# Patient Record
Sex: Female | Born: 1990
Health system: Southern US, Community
[De-identification: ages and names within clinical notes are randomized; demographics above are authoritative.]

## PROBLEM LIST (undated history)

## (undated) DIAGNOSIS — L309 Dermatitis, unspecified: Secondary | ICD-10-CM

## (undated) DIAGNOSIS — R519 Headache, unspecified: Secondary | ICD-10-CM

## (undated) DIAGNOSIS — E282 Polycystic ovarian syndrome: Secondary | ICD-10-CM

## (undated) DIAGNOSIS — N390 Urinary tract infection, site not specified: Secondary | ICD-10-CM

---

## 2011-05-29 ENCOUNTER — Encounter: Payer: Self-pay | Admitting: Emergency Medicine

## 2011-05-29 ENCOUNTER — Emergency Department (HOSPITAL_BASED_OUTPATIENT_CLINIC_OR_DEPARTMENT_OTHER)
Admission: EM | Admit: 2011-05-29 | Discharge: 2011-05-29 | Disposition: A | Discharge status: Home / Self Care | Payer: Managed Care, Other (non HMO) | Attending: Emergency Medicine | Admitting: Emergency Medicine

## 2011-05-29 DIAGNOSIS — J020 Streptococcal pharyngitis: Secondary | ICD-10-CM | POA: Insufficient documentation

## 2011-05-29 DIAGNOSIS — J0300 Acute streptococcal tonsillitis, unspecified: Secondary | ICD-10-CM

## 2011-05-29 LAB — CBC
HCT: 37.5 % (ref 36.0–46.0)
MCH: 26.9 pg (ref 26.0–34.0)
MCHC: 32.5 g/dL (ref 30.0–36.0)
MCV: 82.6 fL (ref 78.0–100.0)
Platelets: 274 10*3/uL (ref 150–400)
RDW: 13 % (ref 11.5–15.5)

## 2011-05-29 LAB — DIFFERENTIAL
Basophils Absolute: 0 10*3/uL (ref 0.0–0.1)
Basophils Relative: 0 % (ref 0–1)
Eosinophils Absolute: 0.1 10*3/uL (ref 0.0–0.7)
Eosinophils Relative: 1 % (ref 0–5)
Monocytes Absolute: 0.8 10*3/uL (ref 0.1–1.0)

## 2011-05-29 MED ORDER — PENICILLIN G BENZATHINE 1200000 UNIT/2ML IM SUSP
1.2000 10*6.[IU] | Freq: Once | INTRAMUSCULAR | Status: AC
  Administered 2011-05-29: 1.2 10*6.[IU] via INTRAMUSCULAR
  Filled 2011-05-29: qty 2

## 2011-05-29 MED ORDER — IBUPROFEN 800 MG PO TABS
800.0000 mg | ORAL_TABLET | Freq: Once | ORAL | Status: AC
  Administered 2011-05-29: 800 mg via ORAL
  Filled 2011-05-29: qty 1

## 2011-05-29 NOTE — ED Provider Notes (Signed)
History     CSN: [id number]  Arrival date & time 05/29/11  1352   First MD Initiated Contact with Patient 05/29/11 1635      Chief Complaint  Patient presents with  . Sore Throat  . Cough  . Nasal Congestion    (Consider location/radiation/quality/duration/timing/severity/associated sxs/prior treatment) HPI Runny nose, nasal congestion, cough, productive yellow green sputum, x two weeks.  Recently treated at her private doctor's office with a shot of dexamethasone.  She apparently got better after that and started getting worse with sore throat difficulty swallowing.  History reviewed. No pertinent past medical history.  History reviewed. No pertinent past surgical history.  History reviewed. No pertinent family history.  History  Substance Use Topics  . Smoking status: Never Smoker   . Smokeless tobacco: Never Used  . Alcohol Use: No    OB History    Grav Para Term Preterm Abortions TAB SAB Ect Mult Living                  Review of Systems  All other systems reviewed and are negative.    Allergies  Review of patient's allergies indicates no known allergies.  Home Medications   Current Outpatient Rx  Name Route Sig Dispense Refill  . ASPIRIN EFFERVESCENT 325 MG PO TBEF Oral Take 325 mg by mouth every 6 (six) hours as needed.      Marland Kitchen DIPHENHYDRAMINE HCL 25 MG PO CAPS Oral Take 25 mg by mouth every 6 (six) hours as needed.      Marland Kitchen LORATADINE 10 MG PO TABS Oral Take 10 mg by mouth daily.        BP 127/73  Pulse 112  Temp(Src) 98.6 F (37 C) (Oral)  Resp 20  Ht 5\' 9"  (1.753 m)  Wt 267 lb 6 oz (121.281 kg)  BMI 39.48 kg/m2  SpO2 99%  LMP 05/23/2011  Physical Exam  Nursing note and vitals reviewed. Constitutional: She is oriented to person, place, and time. She appears well-developed and well-nourished. No distress.  HENT:  Head: Normocephalic and atraumatic.  Mouth/Throat: Oropharyngeal exudate and posterior oropharyngeal erythema present. No tonsillar  abscesses.  Eyes: Pupils are equal, round, and reactive to light.  Neck: Normal range of motion.  Cardiovascular: Normal rate and intact distal pulses.   Pulmonary/Chest: No respiratory distress.  Abdominal: Normal appearance. She exhibits no distension.  Musculoskeletal: Normal range of motion.  Lymphadenopathy:    She has cervical adenopathy.  Neurological: She is alert and oriented to person, place, and time. No cranial nerve deficit.  Skin: Skin is warm and dry. No rash noted.  Psychiatric: She has a normal mood and affect. Her behavior is normal.    ED Course  Procedures   Centor Score =4    Labs Reviewed  DIFFERENTIAL - Abnormal; Notable for the following:    Neutro Abs 7.8 (*)    All other components within normal limits  CBC  MONONUCLEOSIS SCREEN   No results found.   Diagnosis:  #1.  Strep pharyngitis   MDM          Nelia Shi, MD 05/29/11 2321

## 2011-05-29 NOTE — ED Notes (Signed)
Runny nose, nasal congestion, cough, productive yellow green sputum, x two weeks.  No known fever.

## 2015-10-28 ENCOUNTER — Emergency Department (HOSPITAL_BASED_OUTPATIENT_CLINIC_OR_DEPARTMENT_OTHER): Payer: Managed Care, Other (non HMO)

## 2015-10-28 ENCOUNTER — Encounter (HOSPITAL_BASED_OUTPATIENT_CLINIC_OR_DEPARTMENT_OTHER): Payer: Self-pay | Admitting: Emergency Medicine

## 2015-10-28 ENCOUNTER — Emergency Department (HOSPITAL_BASED_OUTPATIENT_CLINIC_OR_DEPARTMENT_OTHER)
Admission: EM | Admit: 2015-10-28 | Discharge: 2015-10-28 | Disposition: A | Discharge status: Home / Self Care | Payer: Managed Care, Other (non HMO) | Attending: Emergency Medicine | Admitting: Emergency Medicine

## 2015-10-28 DIAGNOSIS — M25571 Pain in right ankle and joints of right foot: Secondary | ICD-10-CM | POA: Diagnosis present

## 2015-10-28 DIAGNOSIS — Z79899 Other long term (current) drug therapy: Secondary | ICD-10-CM | POA: Diagnosis not present

## 2015-10-28 DIAGNOSIS — M7661 Achilles tendinitis, right leg: Secondary | ICD-10-CM | POA: Insufficient documentation

## 2015-10-28 IMAGING — DX DG ANKLE COMPLETE 3+V*R*
3 series · 3 of 3 positions shown · non-contrast
Comparison: None.

CLINICAL DATA: Pain for 2 days.  No history of recent trauma

EXAM:
RIGHT ANKLE - COMPLETE 3+ VIEW

[ankle ap]
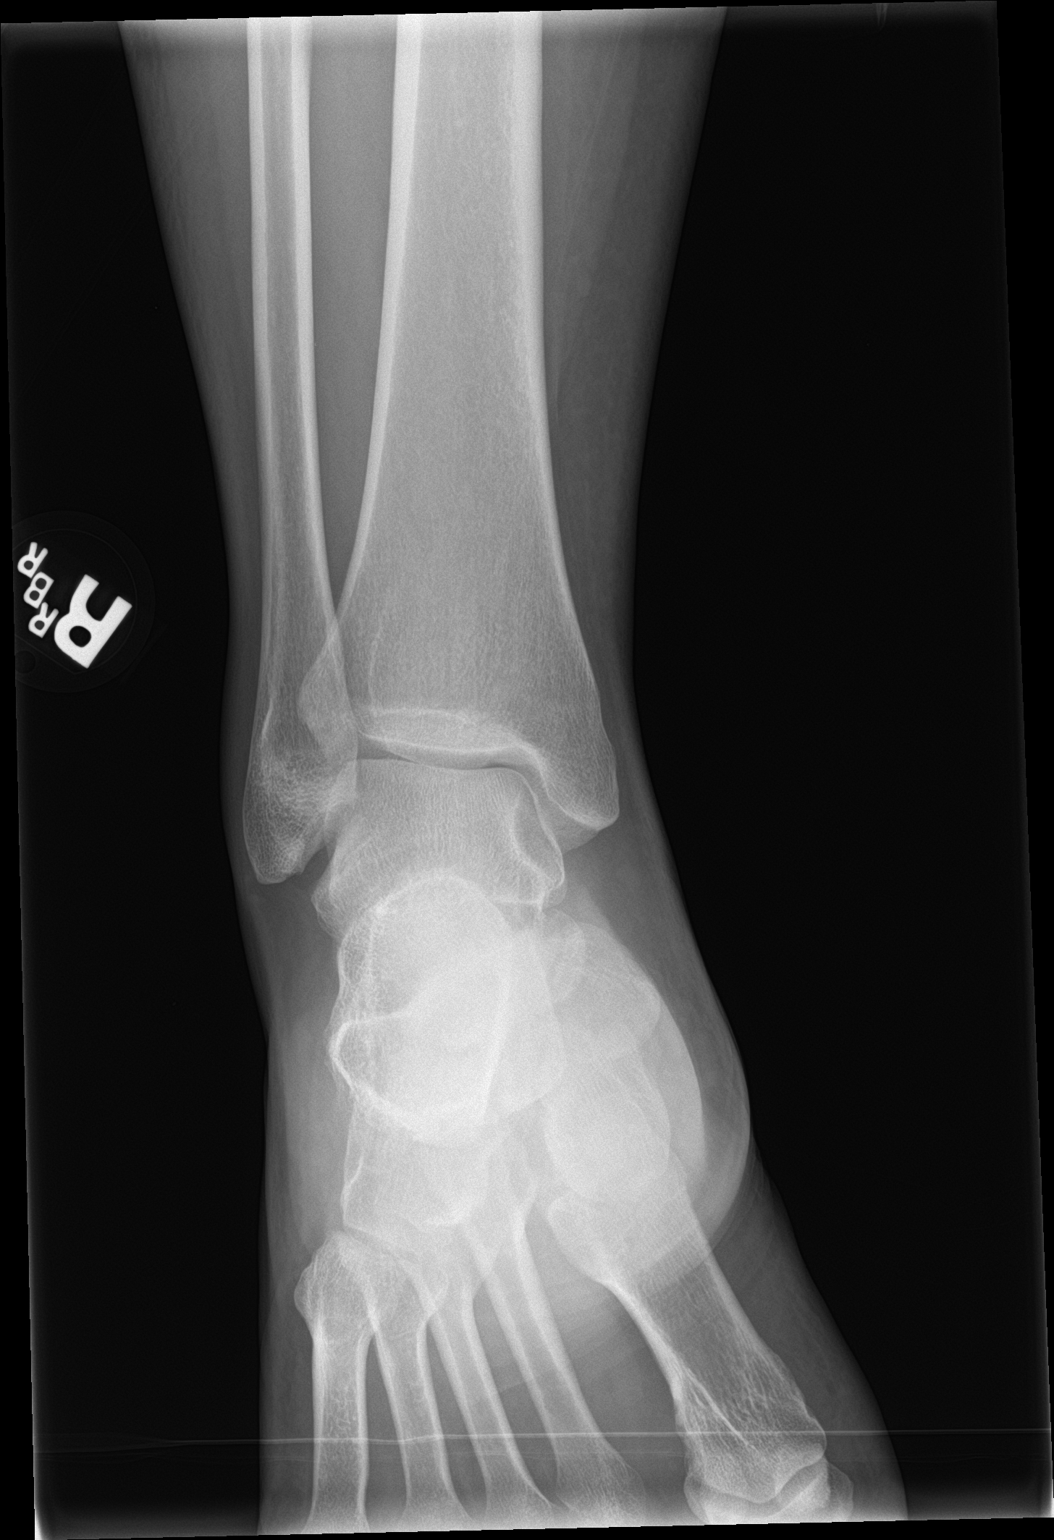

[ankle obl]
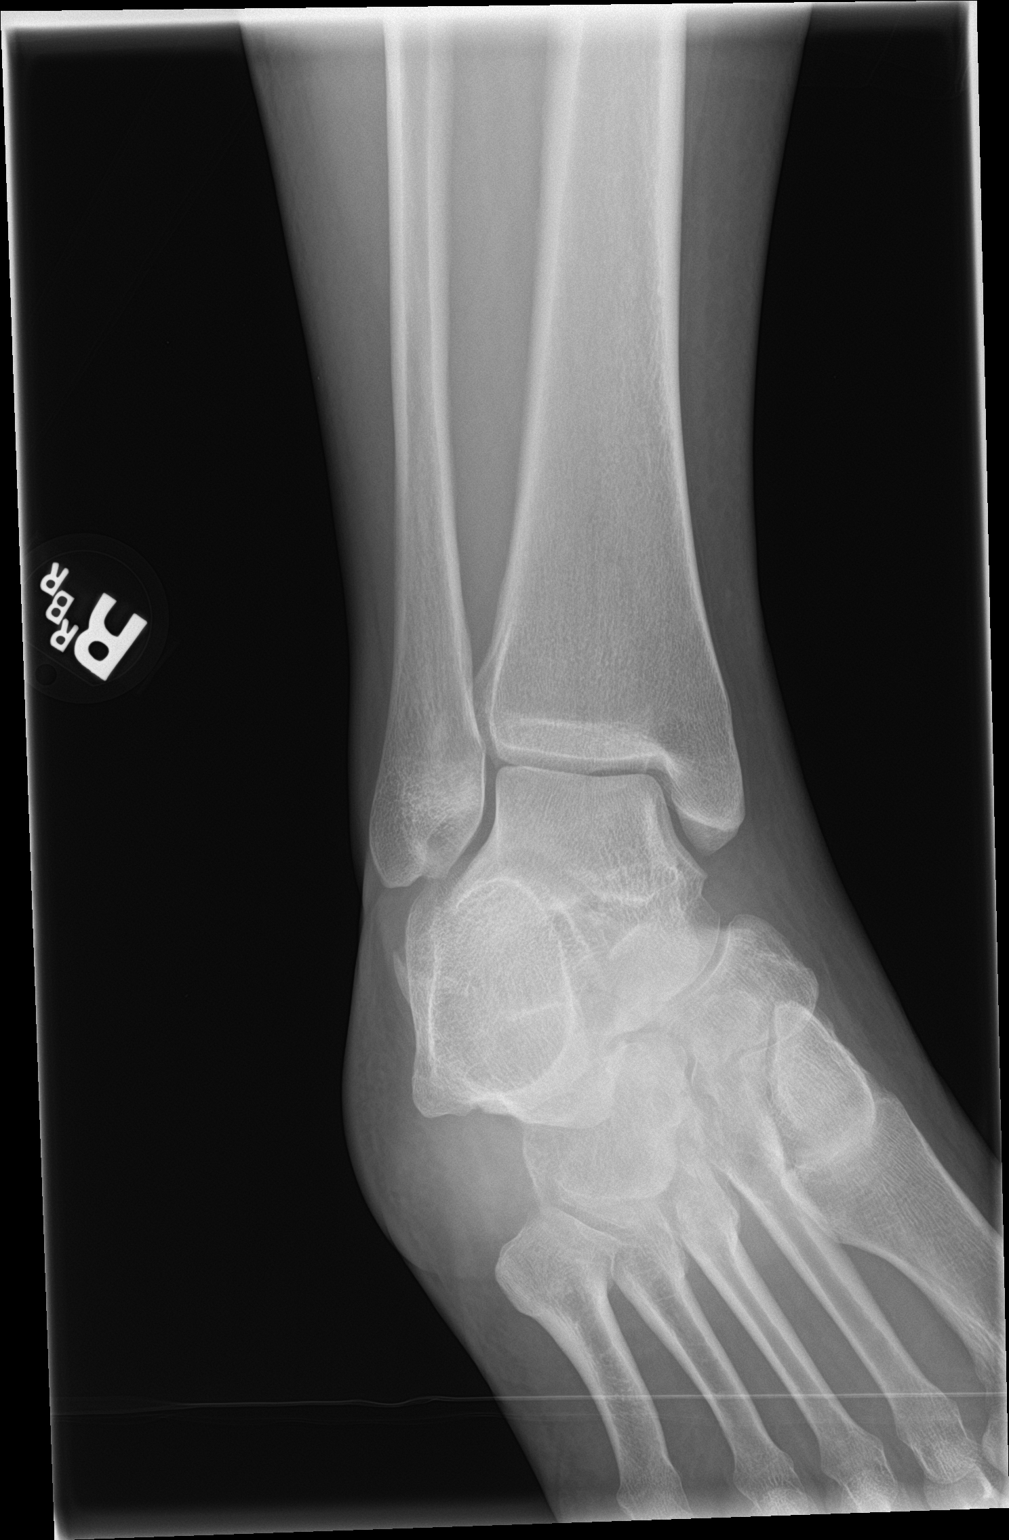

[ankle lat]
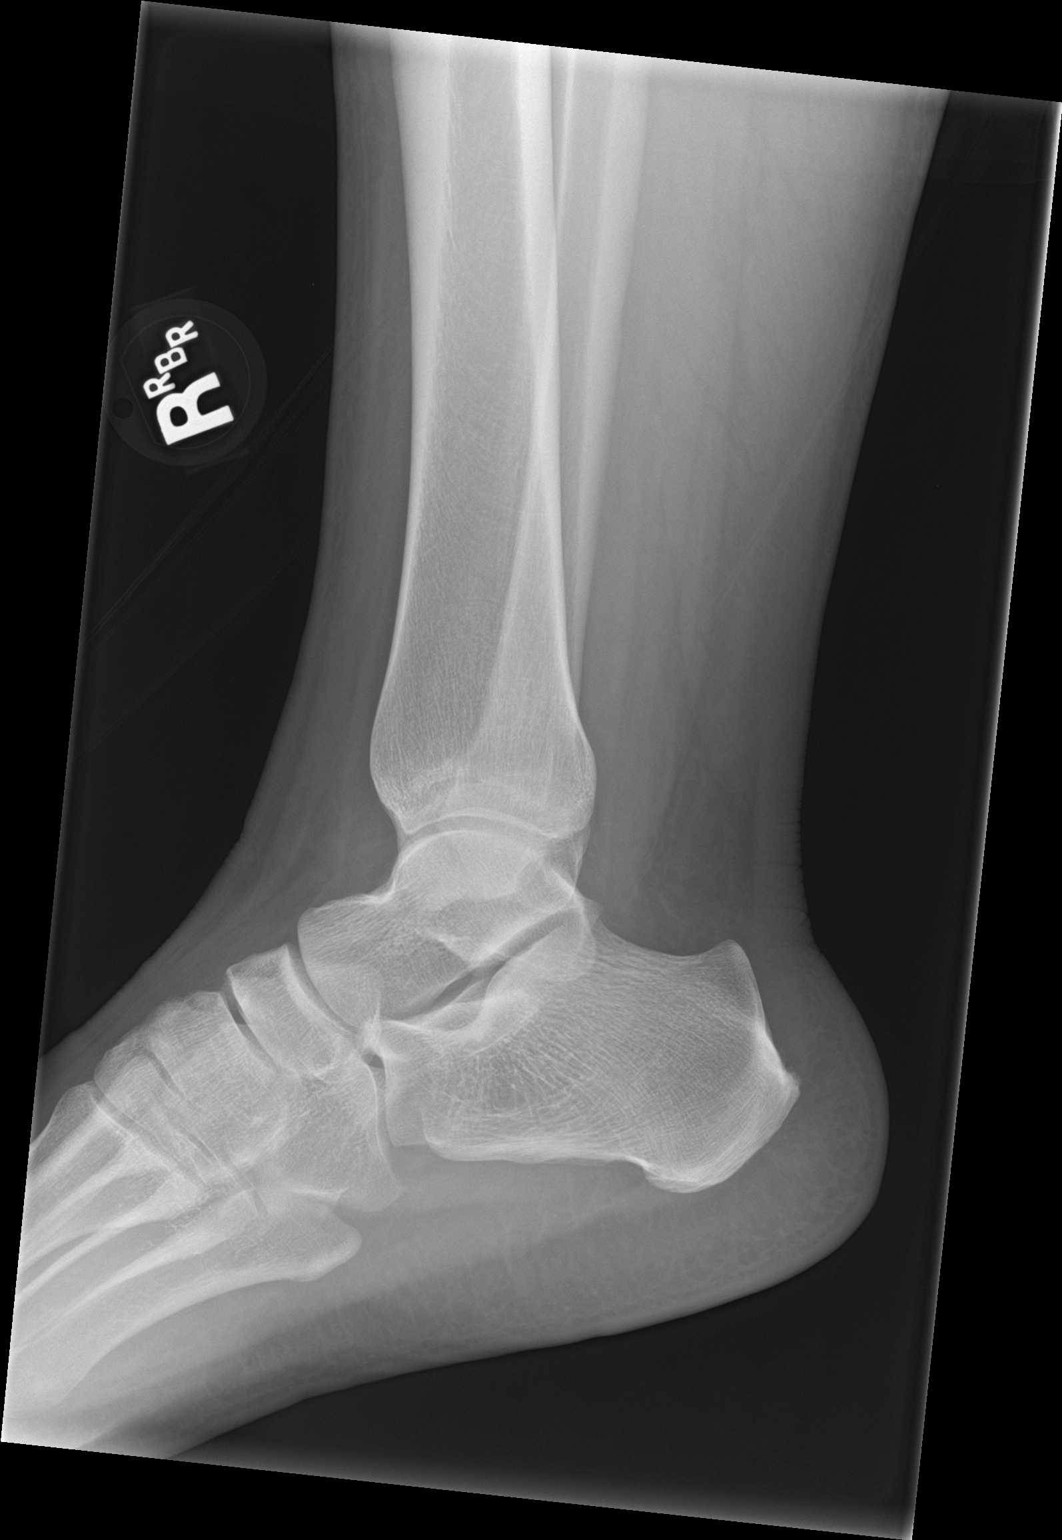

[3 of 3 positions shown; findings below may reference images not displayed]

FINDINGS: Frontal, oblique, and lateral views were obtained. There is no
demonstrable fracture or joint effusion. The ankle mortise appears
intact. No appreciable joint space narrowing.
IMPRESSION: No fracture or appreciable arthropathy. Ankle mortise appears
intact.

## 2015-10-28 MED ORDER — NAPROXEN 500 MG PO TABS: 500.0000 mg | ORAL_TABLET | Freq: Two times a day (BID) | ORAL | Status: DC | PRN

## 2015-10-28 MED ORDER — NAPROXEN 250 MG PO TABS
500.0000 mg | ORAL_TABLET | Freq: Once | ORAL | Status: AC
  Administered 2015-10-28: 500 mg via ORAL
  Filled 2015-10-28: qty 2

## 2015-10-28 MED FILL — NAPROXEN 500 MG TABLET: 500 | 15 days supply | Qty: 30 | Fill #0

## 2015-10-28 NOTE — ED Notes (Signed)
[facility] as ordered. Pt is smiling and laughing with staff in nad.

## 2015-10-28 NOTE — ED Provider Notes (Signed)
CSN: [id number]     Arrival date & time 10/28/15  0821 History   First MD Initiated Contact with Patient 10/28/15 [phone]     Chief Complaint  Patient presents with  . Ankle Pain     (Consider location/radiation/quality/duration/timing/severity/associated sxs/prior Treatment) HPI 25 year old female who presents with right ankle pain. She is otherwise healthy. She works at [employer] and states that she is on her feet for the majority of the day. States that 2 days ago developed pain behind her right ankle, that gradually worsened. She had difficulty walking earlier this morning and came to the ED for evaluation. She did not have any recent trauma or falls. Denies any ankle swelling. Has not had any fevers or chills. Denies any numbness or weakness.  History reviewed. No pertinent past medical history. History reviewed. No pertinent past surgical history. History reviewed. No pertinent family history. Social History  Substance Use Topics  . Smoking status: Never Smoker   . Smokeless tobacco: Never Used  . Alcohol Use: No   OB History    No data available     Review of Systems  Constitutional: Negative for fever.  Musculoskeletal: Positive for arthralgias (right ankle pain).  Skin: Negative for rash and wound.  Allergic/Immunologic: Negative for immunocompromised state.  Neurological: Negative for weakness and numbness.  Hematological: Does not bruise/bleed easily.  All other systems reviewed and are negative.     Allergies  Review of patient's allergies indicates no known allergies.  Home Medications   Prior to Admission medications   Medication Sig Start Date End Date Taking? Authorizing Provider  norgestimate-ethinyl estradiol (MONONESSA) 0.25-35 MG-MCG tablet Take 1 tablet by mouth daily.   Yes Historical Provider, MD  naproxen (NAPROSYN) 500 MG tablet Take 1 tablet (500 mg total) by mouth 2 (two) times daily as needed. 10/28/15   Lavera Guise, MD   BP 126/80 mmHg  Pulse  83  Temp(Src) 98.2 F (36.8 C) (Oral)  Resp 16  Ht  (1.753 m)  Wt 250 lb (113.399 kg)  BMI 36.90 kg/m2  SpO2 100%  LMP 10/05/2015 Physical Exam Physical Exam  Nursing note and vitals reviewed. Constitutional: Well developed, well nourished, non-toxic, and in no acute distress Head: Normocephalic and atraumatic.  Mouth/Throat: Moist mucous membranes. Neck: Normal range of motion. Neck supple.  Cardiovascular: +2 DP pulse of RLE, normal capillary refill distally of RLE Pulmonary/Chest: Effort normal Abdominal: Soft. There is no tenderness. Musculoskeletal: Tenderness to palpation along the Achilles tendon of the right foot. She has full dorsi plantar flexion, eversion and inversion of the right ankle.  Neurological: Alert, no facial droop, fluent speech, Full strength to ankle flexion and extension. Sensation to light touch intact throughout right lower extremity.  Skin: Skin is warm and dry.  Psychiatric: Cooperative  ED Course  Procedures (including critical care time) Labs Review Labs Reviewed - No data to display  Imaging Review Dg Ankle Complete Right  10/28/2015  CLINICAL DATA:  Pain for 2 days.  No history of recent trauma EXAM: RIGHT ANKLE - COMPLETE 3+ VIEW COMPARISON:  None. FINDINGS: Frontal, oblique, and lateral views were obtained. There is no demonstrable fracture or joint effusion. The ankle mortise appears intact. No appreciable joint space narrowing. IMPRESSION: No fracture or appreciable arthropathy. Ankle mortise appears intact. Electronically Signed   By: Bretta Bang III M.D.   On: 10/28/2015 08:57   I have personally reviewed and evaluated these images and lab results as part of my medical decision-making.  EKG Interpretation None      MDM   Final diagnoses:  Achilles tendinitis of right lower extremity    25 year old female who presents with atraumatic right ankle pain. Pain localized to the Achilles tendon, I suspect that she may have some  Achilles tendinopathy. X-ray of the ankle obtained, negative for fracture or soft tissue swelling. I discussed supportive care with anti-inflammatory medications, icing, and brace. CAM walker provided. Given sports medicine referral. Strict return and follow-up instructions reviewed. She expressed understanding of all discharge instructions and felt comfortable with the plan of care.     Lavera Guiseana Duo Akshara Blumenthal, MD 10/28/15 [phone]

## 2015-10-28 NOTE — ED Notes (Signed)
Right ankle pain since Sunday.  No known injury.  Pain increasing.

## 2015-10-28 NOTE — Discharge Instructions (Signed)
You likely have inflammation of the tendon at the back of your ankle. Take ibuprofen OR naprosyn as needed for pain. Ice at rest. Avoid aggravating activity but you are not bed bound.   Return for worsening symptoms, including fever, worsening swelling or redness, or any other symptoms concerning to you.  Achilles Tendinitis Achilles tendinitis is inflammation of the tough, cord-like band that attaches the lower muscles of your leg to your heel (Achilles tendon). It is usually caused by overusing the tendon and joint involved.  CAUSES Achilles tendinitis can happen because of:  A sudden increase in exercise or activity (such as running).  Doing the same exercises or activities (such as jumping) over and over.  Not warming up calf muscles before exercising.  Exercising in shoes that are worn out or not made for exercise.  Having arthritis or a bone growth on the back of the heel bone. This can rub against the tendon and hurt the tendon. SIGNS AND SYMPTOMS The most common symptoms are:  Pain in the back of the leg, just above the heel. The pain usually gets worse with exercise and better with rest.  Stiffness or soreness in the back of the leg, especially in the morning.  Swelling of the skin over the Achilles tendon.  Trouble standing on tiptoe. Sometimes, an Achilles tendon tears (ruptures). Symptoms of an Achilles tendon rupture can include:  Sudden, severe pain in the back of the leg.  Trouble putting weight on the foot or walking normally. DIAGNOSIS Achilles tendinitis will be diagnosed based on symptoms and a physical examination. An X-ray may be done to check if another condition is causing your symptoms. An MRI may be ordered if your health care provider suspects you may have completely torn your tendon, which is called an Achilles tendon rupture.  TREATMENT  Achilles tendinitis usually gets better over time. It can take weeks to months to heal completely. Treatment focuses  on treating the symptoms and helping the injury heal. HOME CARE INSTRUCTIONS   Rest your Achilles tendon and avoid activities that cause pain.  Apply ice to the injured area:  Put ice in a plastic bag.  Place a towel between your skin and the bag.  Leave the ice on for 20 minutes, 2-3 times a day  Try to avoid using the tendon (other than gentle range of motion) while the tendon is painful. Do not resume use until instructed by your health care provider. Then begin use gradually. Do not increase use to the point of pain. If pain does develop, decrease use and continue the above measures. Gradually increase activities that do not cause discomfort until you achieve normal use.  Do exercises to make your calf muscles stronger and more flexible. Your health care provider or physical therapist can recommend exercises for you to do.  Wrap your ankle with an elastic bandage or other wrap. This can help keep your tendon from moving too much. Your health care provider will show you how to wrap your ankle correctly.  Only take over-the-counter or prescription medicines for pain, discomfort, or fever as directed by your health care provider. SEEK MEDICAL CARE IF:   Your pain and swelling increase or pain is uncontrolled with medicines.  You develop new, unexplained symptoms or your symptoms get worse.  You are unable to move your toes or foot.  You develop warmth and swelling in your foot.  You have an unexplained temperature. MAKE SURE YOU:   Understand these instructions.  Will  watch your condition.  Will get help right away if you are not doing well or get worse.   This information is not intended to replace advice given to you by your health care provider. Make sure you discuss any questions you have with your health care provider.   Document Released: 02/17/2005 Document Revised: 05/31/2014 Document Reviewed: 12/20/2012 Elsevier Interactive Patient Education 2016 [employer]  Achilles Tendinitis With Rehab Achilles tendinitis is a disorder of the Achilles tendon. The Achilles tendon connects the large calf muscles (Gastrocnemius and Soleus) to the heel bone (calcaneus). This tendon is sometimes called the heel cord. It is important for pushing-off and standing on your toes and is important for walking, running, or jumping. Tendinitis is often caused by overuse and repetitive microtrauma. SYMPTOMS  Pain, tenderness, swelling, warmth, and redness may occur over the Achilles tendon even at rest.  Pain with pushing off, or flexing or extending the ankle.  Pain that is worsened after or during activity. CAUSES   Overuse sometimes seen with rapid increase in exercise programs or in sports requiring running and jumping.  Poor physical conditioning (strength and flexibility or endurance).  Running sports, especially training running down hills.  Inadequate warm-up before practice or play or failure to stretch before participation.  Injury to the tendon. PREVENTION   Warm up and stretch before practice or competition.  Allow time for adequate rest and recovery between practices and competition.  Keep up conditioning.  Keep up ankle and leg flexibility.  Improve or keep muscle strength and endurance.  Improve cardiovascular fitness.  Use proper technique.  Use proper equipment (shoes, skates).  To help prevent recurrence, taping, protective strapping, or an adhesive bandage may be recommended for several weeks after healing is complete. PROGNOSIS   Recovery may take weeks to several months to heal.  Longer recovery is expected if symptoms have been prolonged.  Recovery is usually quicker if the inflammation is due to a direct blow as compared with overuse or sudden strain. RELATED COMPLICATIONS   Healing time will be prolonged if the condition is not correctly treated. The injury must be given plenty of time to heal.  Symptoms can reoccur  if activity is resumed too soon.  Untreated, tendinitis may increase the risk of tendon rupture requiring additional time for recovery and possibly surgery. TREATMENT   The first treatment consists of rest anti-inflammatory medication, and ice to relieve the pain.  Stretching and strengthening exercises after resolution of pain will likely help reduce the risk of recurrence. Referral to a physical therapist or athletic trainer for further evaluation and treatment may be helpful.  A walking boot or cast may be recommended to rest the Achilles tendon. This can help break the cycle of inflammation and microtrauma.  Arch supports (orthotics) may be prescribed or recommended by your caregiver as an adjunct to therapy and rest.  Surgery to remove the inflamed tendon lining or degenerated tendon tissue is rarely necessary and has shown less than predictable results. MEDICATION   Nonsteroidal anti-inflammatory medications, such as aspirin and ibuprofen, may be used for pain and inflammation relief. Do not take within 7 days before surgery. Take these as directed by your caregiver. Contact your caregiver immediately if any bleeding, stomach upset, or signs of allergic reaction occur. Other minor pain relievers, such as acetaminophen, may also be used.  Pain relievers may be prescribed as necessary by your caregiver. Do not take prescription pain medication for longer than 4 to 7 days. Use  only as directed and only as much as you need.  Cortisone injections are rarely indicated. Cortisone injections may weaken tendons and predispose to rupture. It is better to give the condition more time to heal than to use them. HEAT AND COLD  Cold is used to relieve pain and reduce inflammation for acute and chronic Achilles tendinitis. Cold should be applied for 10 to 15 minutes every 2 to 3 hours for inflammation and pain and immediately after any activity that aggravates your symptoms. Use ice packs or an ice  massage.  Heat may be used before performing stretching and strengthening activities prescribed by your caregiver. Use a heat pack or a warm soak. SEEK MEDICAL CARE IF:  Symptoms get worse or do not improve in 2 weeks despite treatment.  New, unexplained symptoms develop. Drugs used in treatment may produce side effects. EXERCISES RANGE OF MOTION (ROM) AND STRETCHING EXERCISES - Achilles Tendinitis  These exercises may help you when beginning to rehabilitate your injury. Your symptoms may resolve with or without further involvement from your physician, physical therapist or athletic trainer. While completing these exercises, remember:   Restoring tissue flexibility helps normal motion to return to the joints. This allows healthier, less painful movement and activity.  An effective stretch should be held for at least 30 seconds.  A stretch should never be painful. You should only feel a gentle lengthening or release in the stretched tissue. STRETCH - Gastroc, Standing   Place hands on wall.  Extend right / left leg, keeping the front knee somewhat bent.  Slightly point your toes inward on your back foot.  Keeping your right / left heel on the floor and your knee straight, shift your weight toward the wall, not allowing your back to arch.  You should feel a gentle stretch in the right / left calf. Hold this position for __________ seconds. Repeat __________ times. Complete this stretch __________ times per day. STRETCH - Soleus, Standing   Place hands on wall.  Extend right / left leg, keeping the other knee somewhat bent.  Slightly point your toes inward on your back foot.  Keep your right / left heel on the floor, bend your back knee, and slightly shift your weight over the back leg so that you feel a gentle stretch deep in your back calf.  Hold this position for __________ seconds. Repeat __________ times. Complete this stretch __________ times per day. STRETCH -  Gastrocsoleus, Standing  Note: This exercise can place a lot of stress on your foot and ankle. Please complete this exercise only if specifically instructed by your caregiver.   Place the ball of your right / left foot on a step, keeping your other foot firmly on the same step.  Hold on to the wall or a rail for balance.  Slowly lift your other foot, allowing your body weight to press your heel down over the edge of the step.  You should feel a stretch in your right / left calf.  Hold this position for __________ seconds.  Repeat this exercise with a slight bend in your knee. Repeat __________ times. Complete this stretch __________ times per day.  STRENGTHENING EXERCISES - Achilles Tendinitis These exercises may help you when beginning to rehabilitate your injury. They may resolve your symptoms with or without further involvement from your physician, physical therapist or athletic trainer. While completing these exercises, remember:   Muscles can gain both the endurance and the strength needed for everyday activities through controlled exercises.  Complete these exercises as instructed by your physician, physical therapist or athletic trainer. Progress the resistance and repetitions only as guided.  You may experience muscle soreness or fatigue, but the pain or discomfort you are trying to eliminate should never worsen during these exercises. If this pain does worsen, stop and make certain you are following the directions exactly. If the pain is still present after adjustments, discontinue the exercise until you can discuss the trouble with your clinician. STRENGTH - Plantar-flexors   Sit with your right / left leg extended. Holding onto both ends of a rubber exercise band/tubing, loop it around the ball of your foot. Keep a slight tension in the band.  Slowly push your toes away from you, pointing them downward.  Hold this position for __________ seconds. Return slowly, controlling the  tension in the band/tubing. Repeat __________ times. Complete this exercise __________ times per day.  STRENGTH - Plantar-flexors   Stand with your feet shoulder width apart. Steady yourself with a wall or table using as little support as needed.  Keeping your weight evenly spread over the width of your feet, rise up on your toes.*  Hold this position for __________ seconds. Repeat __________ times. Complete this exercise __________ times per day.  *If this is too easy, shift your weight toward your right / left leg until you feel challenged. Ultimately, you may be asked to do this exercise with your right / left foot only. STRENGTH - Plantar-flexors, Eccentric  Note: This exercise can place a lot of stress on your foot and ankle. Please complete this exercise only if specifically instructed by your caregiver.   Place the balls of your feet on a step. With your hands, use only enough support from a wall or rail to keep your balance.  Keep your knees straight and rise up on your toes.  Slowly shift your weight entirely to your right / left toes and pick up your opposite foot. Gently and with controlled movement, lower your weight through your right / left foot so that your heel drops below the level of the step. You will feel a slight stretch in the back of your calf at the end position.  Use the healthy leg to help rise up onto the balls of both feet, then lower weight only on the right / left leg again. Build up to 15 repetitions. Then progress to 3 consecutive sets of 15 repetitions.*  After completing the above exercise, complete the same exercise with a slight knee bend (about 30 degrees). Again, build up to 15 repetitions. Then progress to 3 consecutive sets of 15 repetitions.* Perform this exercise __________ times per day.  *When you easily complete 3 sets of 15, your physician, physical therapist or athletic trainer may advise you to add resistance by wearing a backpack filled with  additional weight. STRENGTH - Plantar Flexors, Seated   Sit on a chair that allows your feet to rest flat on the ground. If necessary, sit at the edge of the chair.  Keeping your toes firmly on the ground, lift your right / left heel as far as you can without increasing any discomfort in your ankle. Repeat __________ times. Complete this exercise __________ times a day. *If instructed by your physician, physical therapist or athletic trainer, you may add ____________________ of resistance by placing a weighted object on your right / left knee.   This information is not intended to replace advice given to you by your health care provider. Make sure  you discuss any questions you have with your health care provider.   Document Released: 12/09/2004 Document Revised: 05/31/2014 Document Reviewed: 08/22/2008 Else[employer] Interactive Patient Education [employer]

## 2015-11-03 ENCOUNTER — Ambulatory Visit: Payer: Managed Care, Other (non HMO) | Admitting: Family Medicine

## 2016-06-14 DIAGNOSIS — F419 Anxiety disorder, unspecified: Secondary | ICD-10-CM | POA: Insufficient documentation

## 2016-06-14 DIAGNOSIS — E669 Obesity, unspecified: Secondary | ICD-10-CM | POA: Insufficient documentation

## 2016-06-14 DIAGNOSIS — E282 Polycystic ovarian syndrome: Secondary | ICD-10-CM | POA: Insufficient documentation

## 2016-06-14 DIAGNOSIS — L680 Hirsutism: Secondary | ICD-10-CM | POA: Insufficient documentation

## 2018-09-01 DIAGNOSIS — R894 Abnormal immunological findings in specimens from other organs, systems and tissues: Secondary | ICD-10-CM | POA: Insufficient documentation

## 2020-05-24 NOTE — L&D Delivery Note (Deleted)
OB ADMISSION/ HISTORY & PHYSICAL:  Admission Date: 05/19/2021 10:17 PM  Admit Diagnosis: Elevated blood pressures  Stephanie Burton is a 30 y.o. female G2P1001 [redacted]w[redacted]d presenting with complaints of LOF and contractions while at home earlier. Endorses active FM, denies vaginal bleeding. Negative amnisure, negative fern.   Mild range elevated blood pressures while in MAU. Denies headache, vision changes or epigastric pain. NST - non-reactive, BPP 8/8.   History of current pregnancy: G2P1001   Primary OB Provider: Dr. Steva Ready [facility]) Patient entered care with [facility] at 10 wks.   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, has not been discussed with patient) Desires permanent sterilization (bilateral salpingectomy)  Patient Active Problem List   Diagnosis Date Noted   Elevated blood pressure affecting pregnancy in third trimester, antepartum 05/20/2021   Abnormal fetal ultrasound 04/30/2021   Herpes simplex antibody positive 09/01/2018   PCOS (polycystic ovarian syndrome) 06/14/2016   Anxiety 06/14/2016   Hirsutism 06/14/2016   Obesity 06/14/2016    Prenatal Labs: ABO, Rh: --/--/A POS (12/08 1517) Antibody: NEG (12/08 1517) Rubella: Immune (06/11 0000)  RPR: Nonreactive (06/11 0000)  HBsAg: Negative (06/11 0000)  HIV: Non-reactive (06/11 0000)  GTT: 94.6 GBS:   neg GC/CHL: negative    OB History  Gravida Para Term Preterm AB Living  2 1 1     1   SAB IAB Ectopic Multiple Live Births          1    # Outcome Date GA Lbr Len/2nd Weight Sex Delivery Anes PTL Lv  2 Current           1 Term 09/16/18    M CS-LTranv  N LIV     Complications: Fetal Intolerance    Medical / Surgical History: Past medical history:  Past Medical History:  Diagnosis Date   Eczema    Headache    PCOS (polycystic ovarian syndrome)    UTI (urinary tract infection)     Past  surgical history:  Past Surgical History:  Procedure Laterality Date   CESAREAN SECTION     Family History:  Family History  Problem Relation Age of Onset   Heart disease Mother        blockage,had stint   Diabetes Mother    Hypertension Father     Social History:  reports that she has never smoked. She has never used smokeless tobacco. She reports that she does not drink alcohol and does not use drugs.  Allergies: Latex   Current Medications at time of admission:  Prior to Admission medications   Medication Sig Start Date End Date Taking? Authorizing Provider  Prenatal MV & Min w/FA-DHA (PRENATAL GUMMIES PO) Take 2 tablets by mouth in the morning.   Yes [provider]  calcium carbonate (TUMS - DOSED IN MG ELEMENTAL CALCIUM) 500 MG chewable tablet Chew 2 tablets by mouth 3 (three) times daily as needed for indigestion or heartburn.    [provider]    Review of Systems: Constitutional: Negative   HENT: Negative   Eyes: Negative   Respiratory: Negative   Cardiovascular: Negative   Gastrointestinal: Negative  Genitourinary: neg for bloody show, neg for LOF   Musculoskeletal: Negative   Skin: Negative   Neurological: Negative   Endo/Heme/Allergies: Negative   Psychiatric/Behavioral: Negative    Physical Exam: VS: Blood pressure 133/70, pulse 98, temperature 97.9  F (36.6 C), temperature source Oral, resp. rate 20, height 5\' 9"  (1.753 m), weight (!) 158.6 kg, SpO2 98 %. AAO x3, no signs of distress Cardiovascular: RRR Respiratory: Lung fields clear to ausculation GU/GI: Abdomen gravid, non-tender, non-distended, active FM, vertex, EFW 8lbs per Leopold's Extremities: biltaeral edema, negative for pain, tenderness, and cords  Cervical exam:  FHR: baseline rate 150 / variability moderate / accelerations present / no decelerations TOCO: irregular   Prenatal Transfer Tool  Maternal Diabetes: No Genetic Screening: Normal Maternal  Ultrasounds/Referrals: Normal Fetal Ultrasounds or other Referrals:  None Maternal Substance Abuse:  No Significant Maternal Medications:  None Significant Maternal Lab Results: Group B Strep negative    Assessment: 30 y.o. G2P1001 [redacted]w[redacted]d being admitted for observation for mild range elevated blood pressures.   Plan:  Admit to [facility] for observation Repeat PIH labs in morning Continuous monitoring FHR category - Cat 1 Dr [facility] notified of admission and plan of care  Normand Sloop MSN, CNM 05/20/2021 3:01 AM

## 2020-10-28 LAB — OB RESULTS CONSOLE HIV ANTIBODY (ROUTINE TESTING): HIV: NONREACTIVE

## 2020-10-28 LAB — OB RESULTS CONSOLE RUBELLA ANTIBODY, IGM: Rubella: IMMUNE

## 2020-10-28 LAB — OB RESULTS CONSOLE HEPATITIS B SURFACE ANTIGEN: Hepatitis B Surface Ag: NEGATIVE

## 2020-11-01 LAB — OB RESULTS CONSOLE ABO/RH: RH Type: POSITIVE

## 2020-11-01 LAB — OB RESULTS CONSOLE HEPATITIS B SURFACE ANTIGEN: Hepatitis B Surface Ag: NEGATIVE

## 2020-11-01 LAB — HEPATITIS C ANTIBODY: HCV Ab: NEGATIVE

## 2020-11-01 LAB — OB RESULTS CONSOLE RUBELLA ANTIBODY, IGM: Rubella: IMMUNE

## 2020-11-01 LAB — OB RESULTS CONSOLE ANTIBODY SCREEN: Antibody Screen: NEGATIVE

## 2020-11-01 LAB — OB RESULTS CONSOLE RPR: RPR: NONREACTIVE

## 2020-11-01 LAB — OB RESULTS CONSOLE HIV ANTIBODY (ROUTINE TESTING): HIV: NONREACTIVE

## 2021-03-01 ENCOUNTER — Other Ambulatory Visit: Payer: Self-pay

## 2021-03-01 ENCOUNTER — Inpatient Hospital Stay (HOSPITAL_COMMUNITY)
Admission: AD | Admit: 2021-03-01 | Discharge: 2021-03-01 | Disposition: A | Discharge status: Home / Self Care | Payer: No Typology Code available for payment source | Attending: Obstetrics and Gynecology | Admitting: Obstetrics and Gynecology

## 2021-03-01 ENCOUNTER — Inpatient Hospital Stay (HOSPITAL_COMMUNITY): Payer: No Typology Code available for payment source

## 2021-03-01 ENCOUNTER — Encounter (HOSPITAL_COMMUNITY): Payer: Self-pay | Admitting: Obstetrics & Gynecology

## 2021-03-01 DIAGNOSIS — R42 Dizziness and giddiness: Secondary | ICD-10-CM | POA: Insufficient documentation

## 2021-03-01 DIAGNOSIS — Z3A26 26 weeks gestation of pregnancy: Secondary | ICD-10-CM | POA: Diagnosis not present

## 2021-03-01 DIAGNOSIS — O26892 Other specified pregnancy related conditions, second trimester: Secondary | ICD-10-CM | POA: Insufficient documentation

## 2021-03-01 DIAGNOSIS — O26893 Other specified pregnancy related conditions, third trimester: Secondary | ICD-10-CM

## 2021-03-01 DIAGNOSIS — Z3689 Encounter for other specified antenatal screening: Secondary | ICD-10-CM

## 2021-03-01 DIAGNOSIS — R519 Headache, unspecified: Secondary | ICD-10-CM | POA: Diagnosis not present

## 2021-03-01 DIAGNOSIS — H538 Other visual disturbances: Secondary | ICD-10-CM | POA: Diagnosis not present

## 2021-03-01 LAB — URINALYSIS, ROUTINE W REFLEX MICROSCOPIC
Bilirubin Urine: NEGATIVE
Glucose, UA: NEGATIVE mg/dL
Hgb urine dipstick: NEGATIVE
Ketones, ur: NEGATIVE mg/dL
Nitrite: NEGATIVE
Protein, ur: 30 mg/dL — AB
Specific Gravity, Urine: 1.023 (ref 1.005–1.030)
pH: 6 (ref 5.0–8.0)

## 2021-03-01 LAB — COMPREHENSIVE METABOLIC PANEL
ALT: 11 U/L (ref 0–44)
AST: 14 U/L — ABNORMAL LOW (ref 15–41)
Albumin: 2.9 g/dL — ABNORMAL LOW (ref 3.5–5.0)
Alkaline Phosphatase: 57 U/L (ref 38–126)
Anion gap: 7 (ref 5–15)
BUN: 5 mg/dL — ABNORMAL LOW (ref 6–20)
CO2: 21 mmol/L — ABNORMAL LOW (ref 22–32)
Calcium: 9.1 mg/dL (ref 8.9–10.3)
Chloride: 107 mmol/L (ref 98–111)
Creatinine, Ser: 0.6 mg/dL (ref 0.44–1.00)
GFR, Estimated: >60 mL/min (ref >60)
Glucose, Bld: 103 mg/dL — ABNORMAL HIGH (ref 70–99)
Potassium: 3.9 mmol/L (ref 3.5–5.1)
Sodium: 135 mmol/L (ref 135–145)
Total Bilirubin: 0.6 mg/dL (ref 0.3–1.2)
Total Protein: 7.1 g/dL (ref 6.5–8.1)

## 2021-03-01 LAB — CBC
HCT: 39 % (ref 36.0–46.0)
Hemoglobin: 12.8 g/dL (ref 12.0–15.0)
MCH: 27.8 pg (ref 26.0–34.0)
MCHC: 32.8 g/dL (ref 30.0–36.0)
MCV: 84.6 fL (ref 80.0–100.0)
Platelets: 277 10*3/uL (ref 150–400)
RBC: 4.61 MIL/uL (ref 3.87–5.11)
RDW: 12.6 % (ref 11.5–15.5)
WBC: 8.2 10*3/uL (ref 4.0–10.5)
nRBC: 0 % (ref 0.0–0.2)

## 2021-03-01 IMAGING — MR MR MRA HEAD W/O CM
1 series · 19 of 48 positions shown · non-contrast
Comparison: Same-day MRI head and MRV head [DATE].

CLINICAL DATA: Headache, chronic, new features or increased
frequency; new onset blurry vision in right eye. Additional history
provided: Patient reports dizziness/blurry vision for 2 weeks, 26
weeks pregnant.

EXAM:
MRA HEAD WITHOUT CONTRAST
TECHNIQUE: Angiographic images of the Circle of Willis were acquired using MRA
technique without intravenous contrast.

[Series 10: 3d cow · axial · 0.5mm · 0.41mm/px · z∈[-93,-12]mm · 19 of 172 slices shown]
[im 1/172]
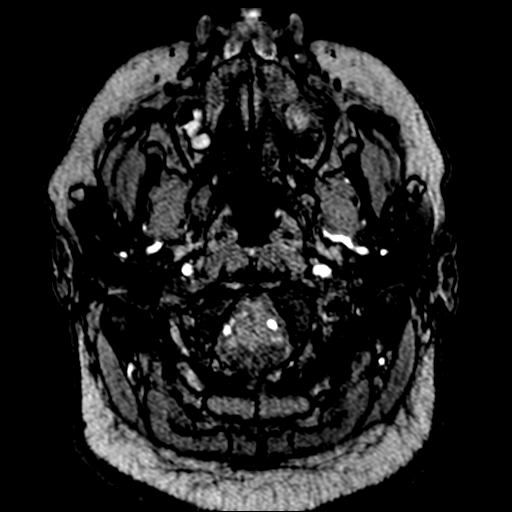
[im 4/172]
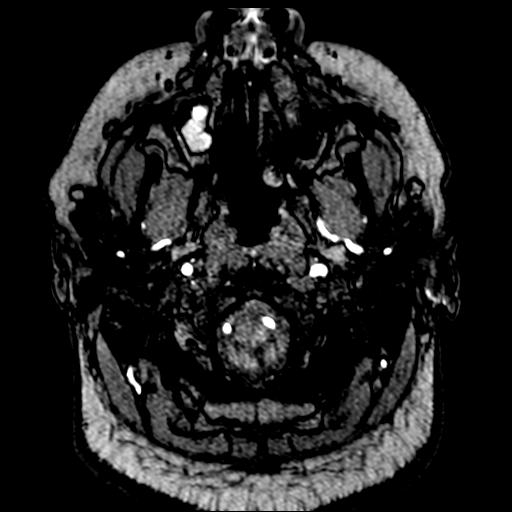
[im 8/172]
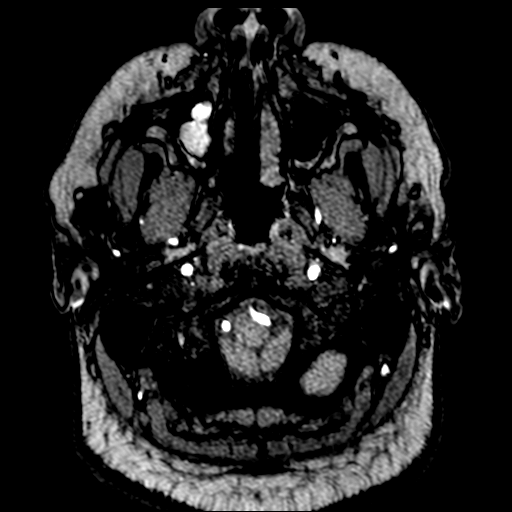
[im 11/172]
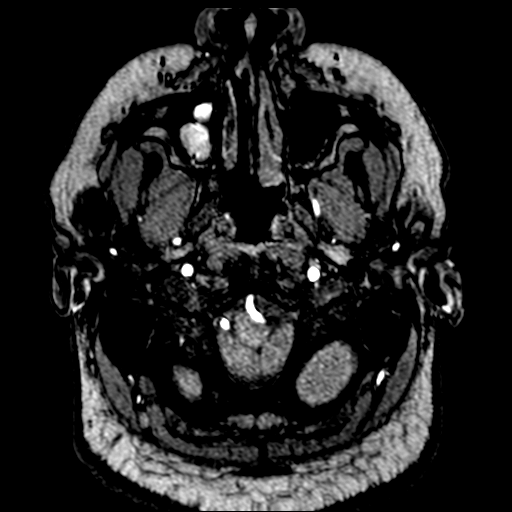
[im 15/172]
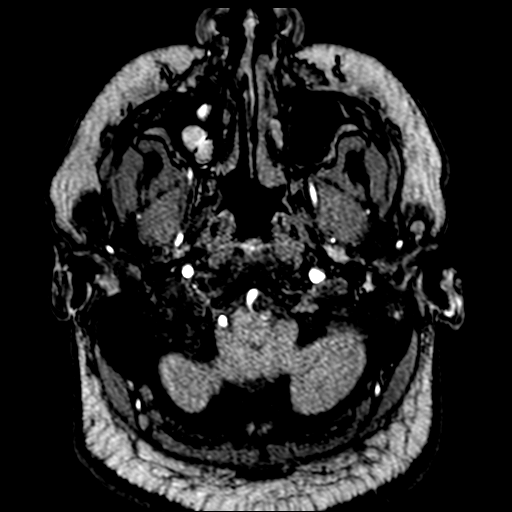
[im 19/172]
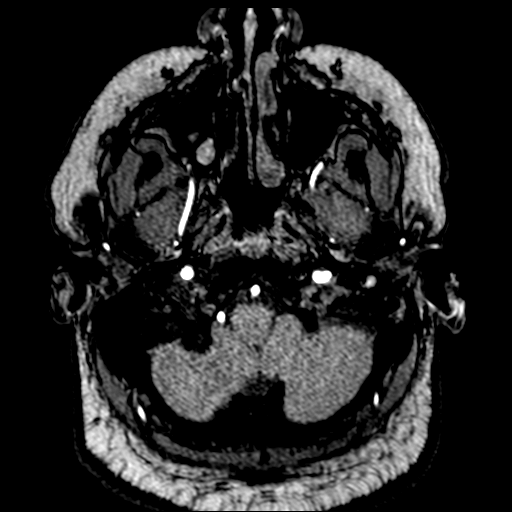
[im 22/172]
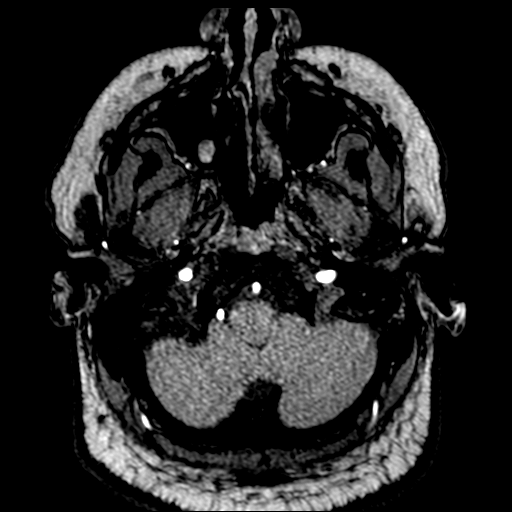
[im 26/172]
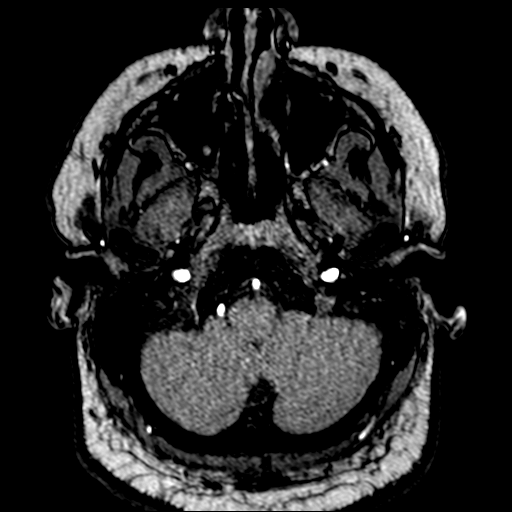
[im 30/172]
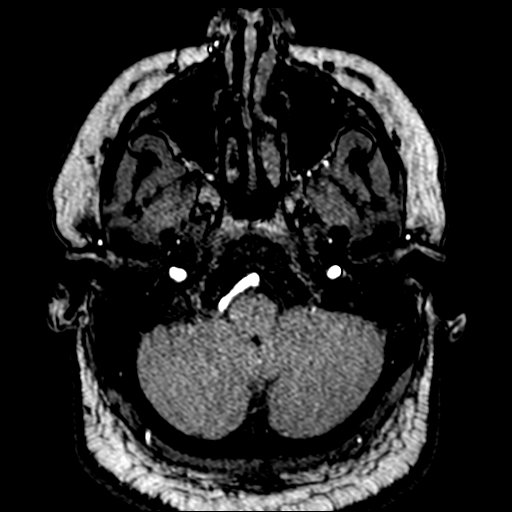
[im 33/172]
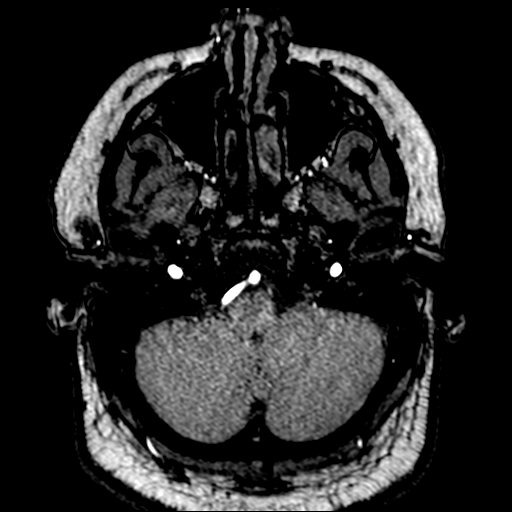
[im 37/172]
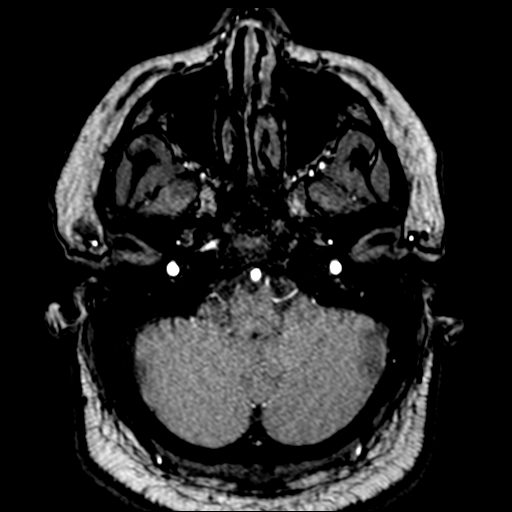
[im 55/172]
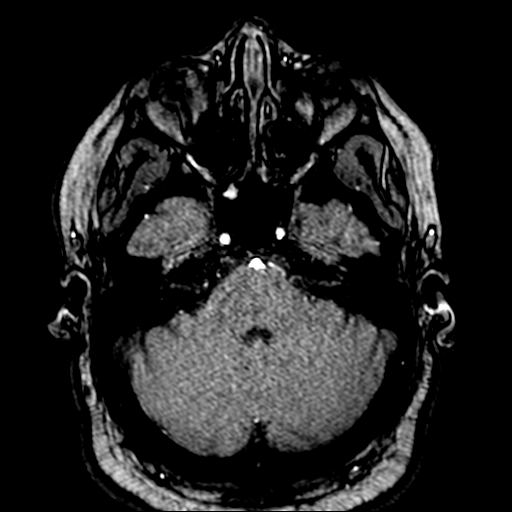
[im 77/172]
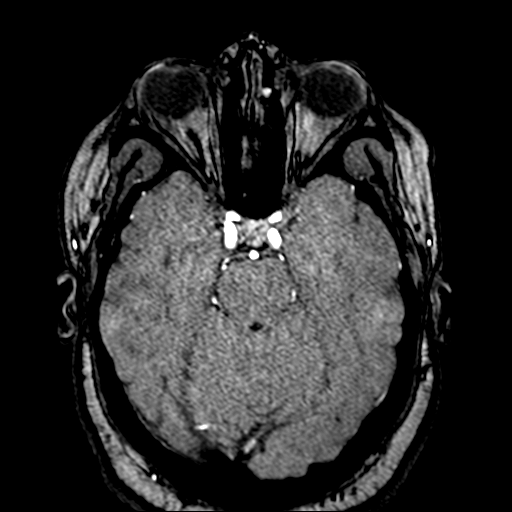
[im 88/172]
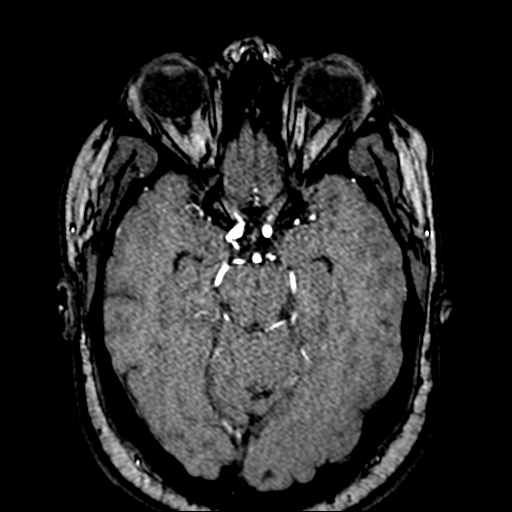
[im 99/172]
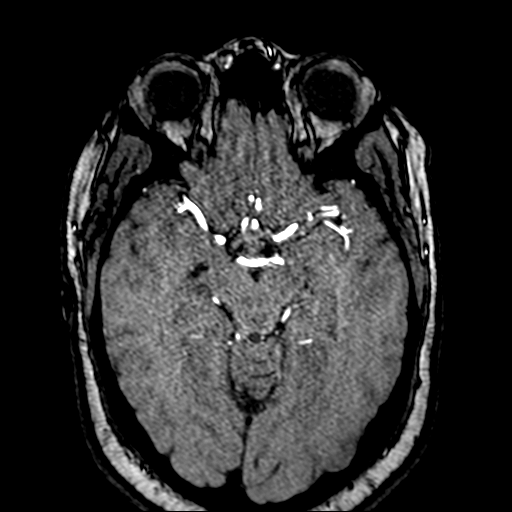
[im 121/172]
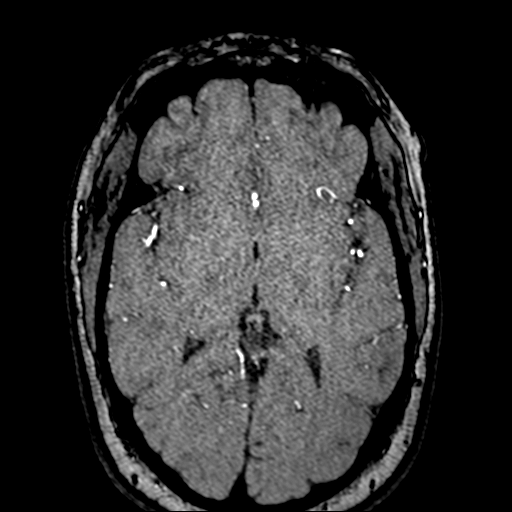
[im 142/172]
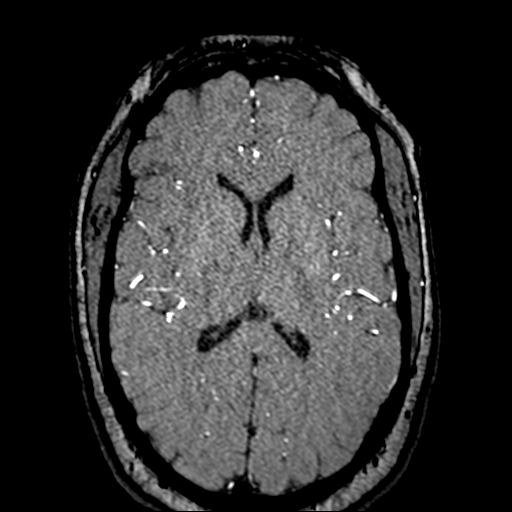
[im 146/172]
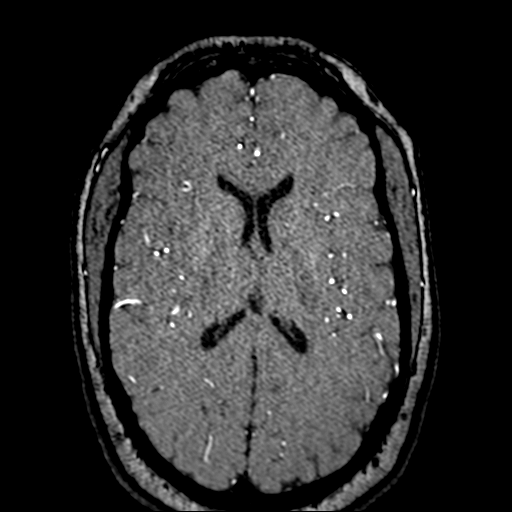
[im 164/172]
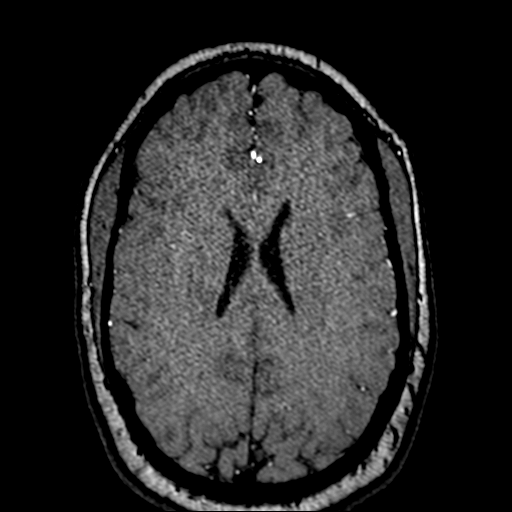

[19 of 48 positions shown; findings below may reference images not displayed]

FINDINGS: Anterior circulation:

The intracranial internal carotid arteries are patent. The M1 middle
cerebral arteries are patent. No M2 proximal branch occlusion or
high-grade proximal stenosis is identified. The anterior cerebral
arteries are patent.

2 mm broad-based inferiorly projecting vascular protrusion arising
from the cavernous left ICA, which may reflect a small aneurysm
(series [5U], image 195).

Posterior circulation:

The intracranial vertebral arteries are patent. The basilar artery
is patent. The posterior cerebral arteries are patent. A right
posterior communicating artery is present. The left posterior
communicating artery is diminutive or absent.

Anatomic variants: As described.
IMPRESSION: No intracranial large vessel occlusion or proximal high-grade
arterial stenosis.

2 mm broad-based inferiorly projecting vascular protrusion arising
from the cavernous left internal carotid artery, which may reflect a
small aneurysm.

## 2021-03-01 IMAGING — MR MR MRV HEAD W/O CM
3 series · 42 of 48 positions shown · non-contrast
Comparison: Same-day MRI head and MRA head [DATE].

CLINICAL DATA: Headache, chronic, no new features; blurry vision
right eye. Additional history provided: Patient reports worsening
headaches, dizziness/blurry vision for 2 weeks. Approximally 26
weeks pregnant.

EXAM:
MR VENOGRAM HEAD WITHOUT CONTRAST
TECHNIQUE: Angiographic images of the intracranial venous structures were
acquired using MRV technique without intravenous contrast.

[Series 22: tof_fl2d_paracor · coronal · 2.0mm · 0.86mm/px · 14 of 136 slices shown]
[im 1/136]
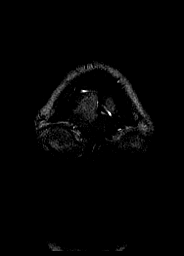
[im 11/136]
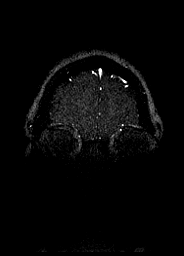
[im 21/136]
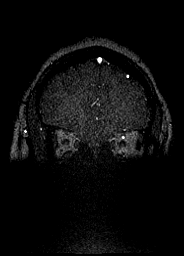
[im 32/136]
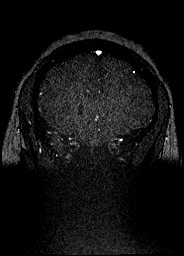
[im 42/136]
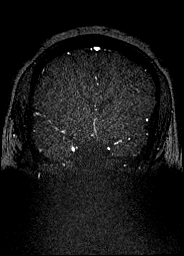
[im 52/136]
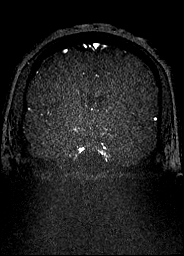
[im 63/136]
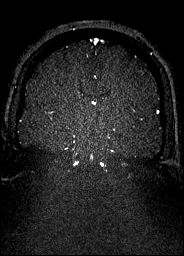
[im 73/136]
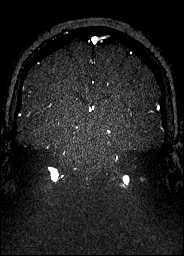
[im 84/136]
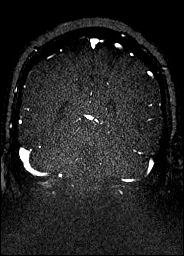
[im 94/136]
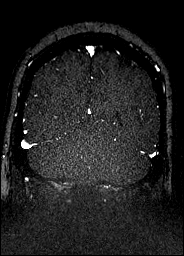
[im 104/136]
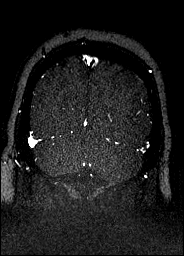
[im 115/136]
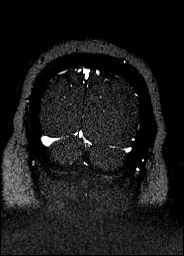
[im 125/136]
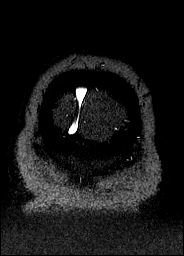
[im 136/136]
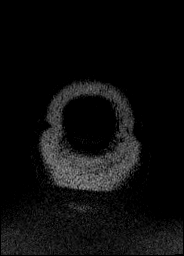

[Series 26: venous inhance coronal · coronal · portal-venous · 0.9mm · 0.57mm/px · 17 of 176 slices shown]
[im 1/176]
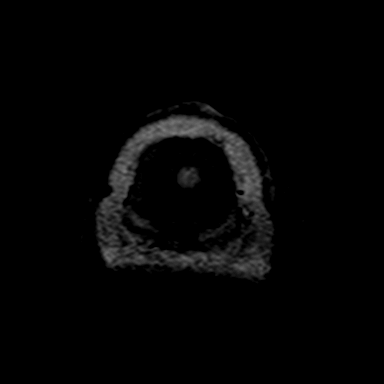
[im 11/176]
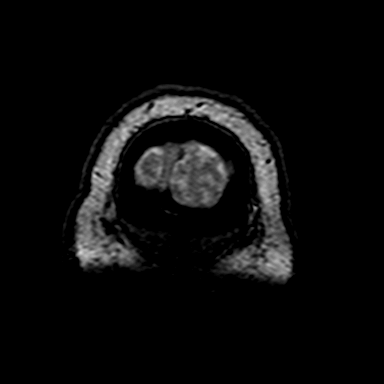
[im 22/176]
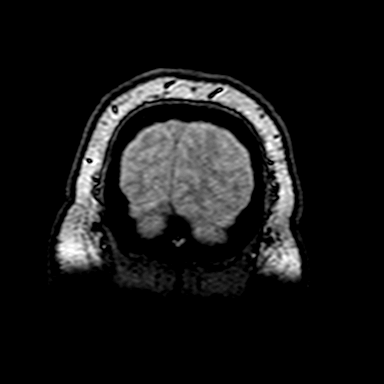
[im 33/176]
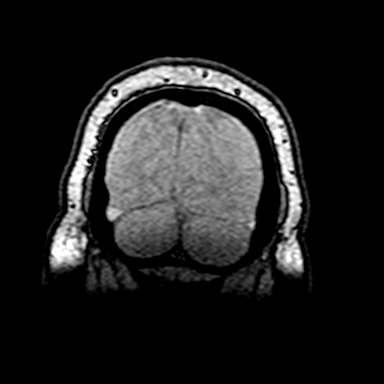
[im 44/176]
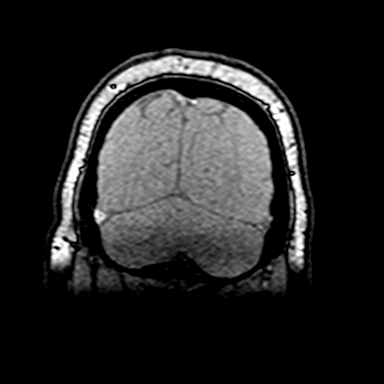
[im 55/176]
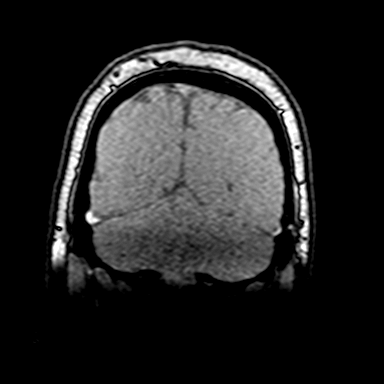
[im 66/176]
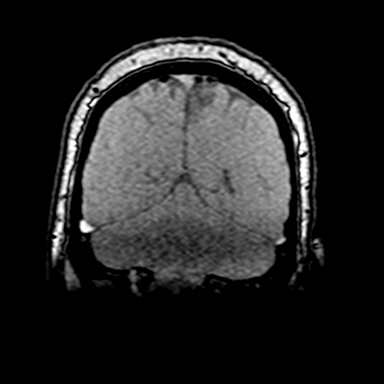
[im 77/176]
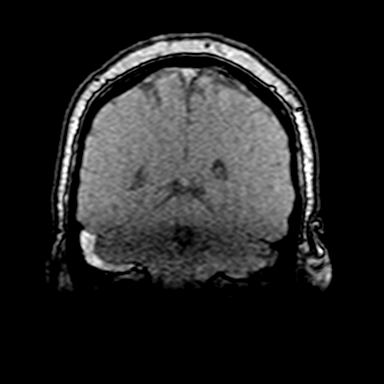
[im 88/176]
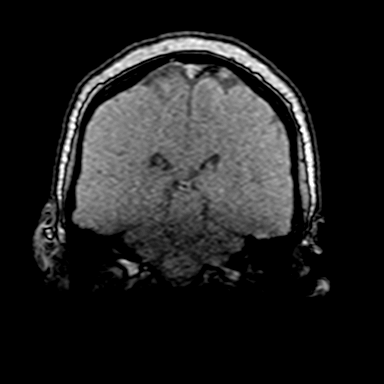
[im 99/176]
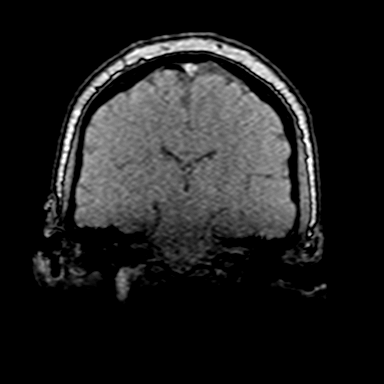
[im 110/176]
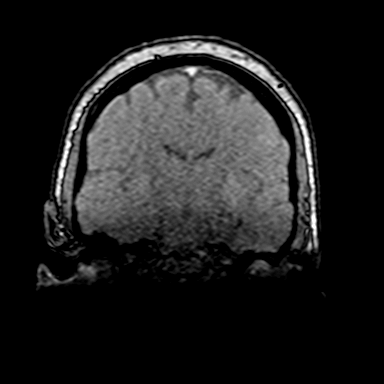
[im 121/176]
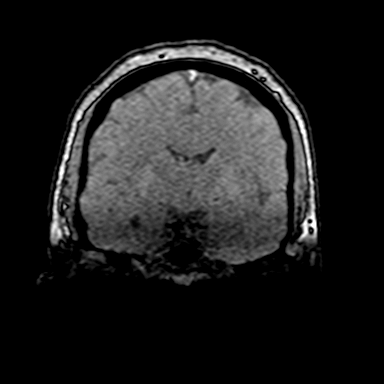
[im 132/176]
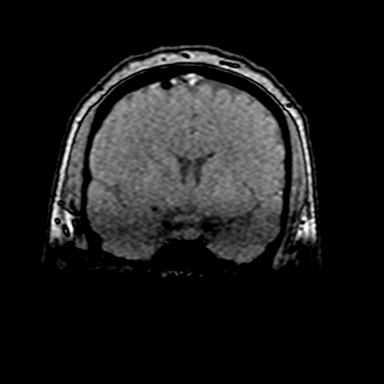
[im 143/176]
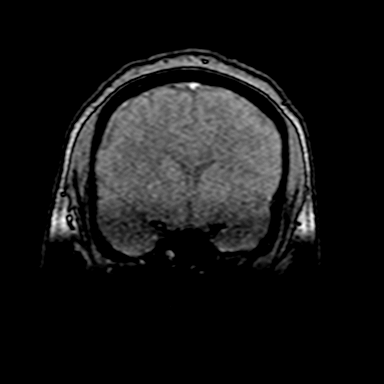
[im 154/176]
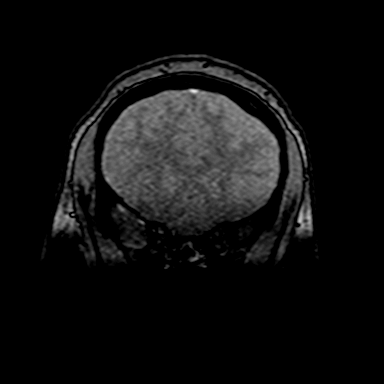
[im 165/176]
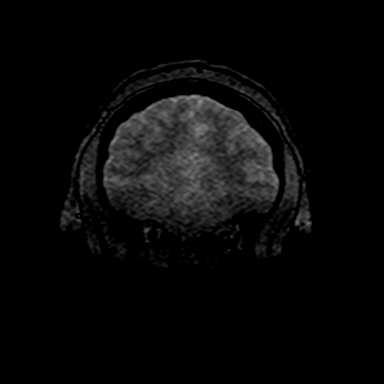
[im 176/176]
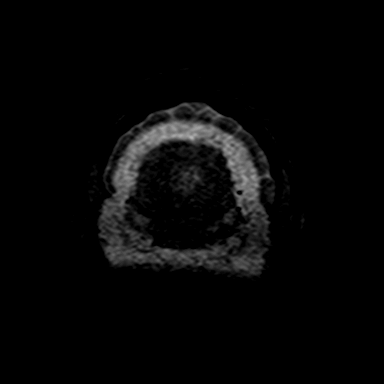

[Series 27: venous inhance coronal_msum · coronal · portal-venous · 0.9mm · 0.57mm/px · 11 of 176 slices shown]
[im 11/176]
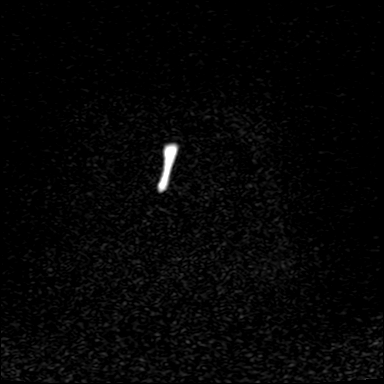
[im 22/176]
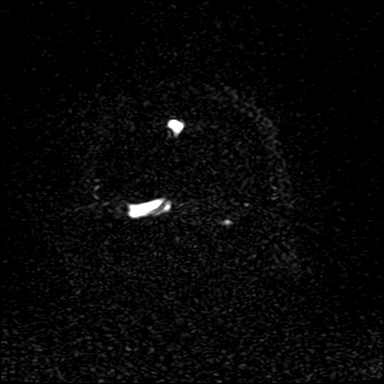
[im 33/176]
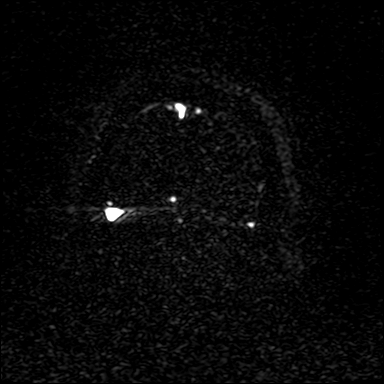
[im 55/176]
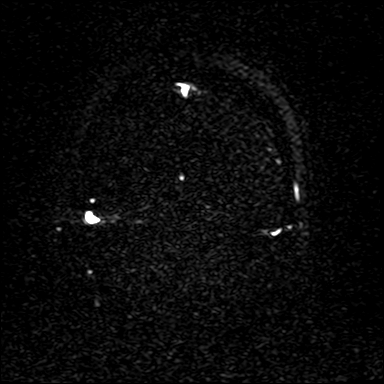
[im 77/176]
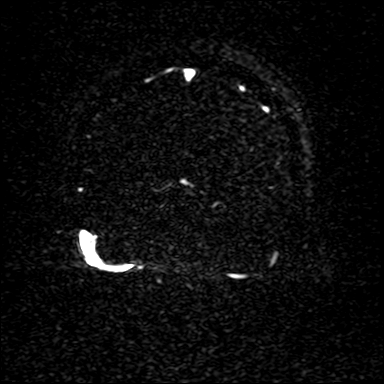
[im 88/176]
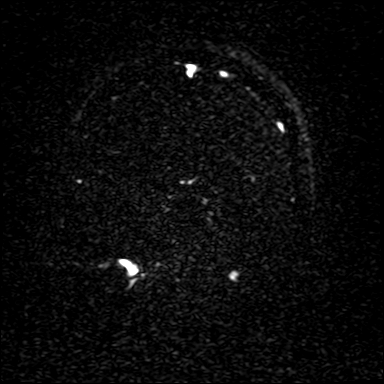
[im 99/176]
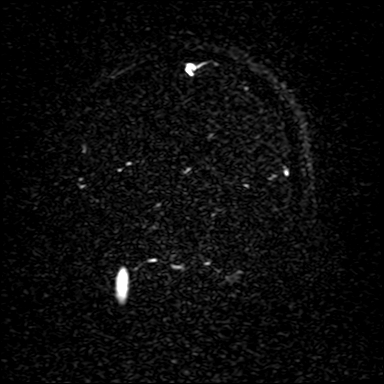
[im 121/176]
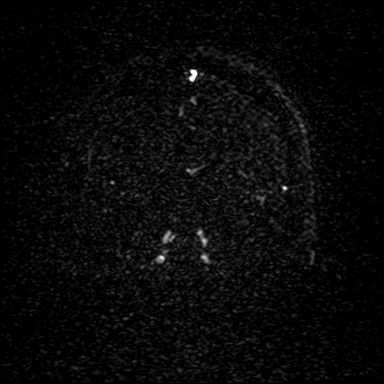
[im 143/176]
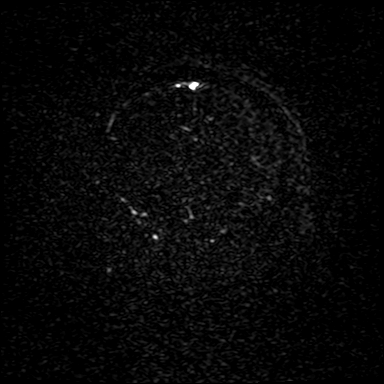
[im 154/176]
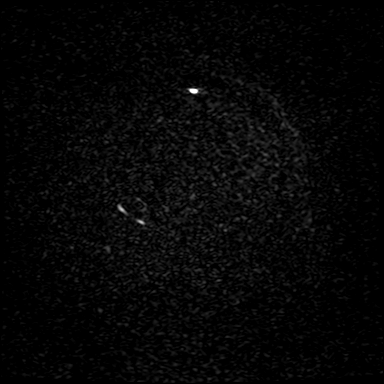
[im 165/176]
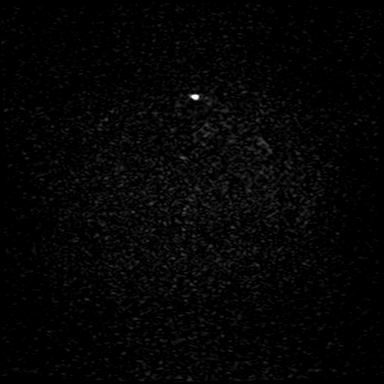

[42 of 48 positions shown; findings below may reference images not displayed]

FINDINGS: The left transverse and sigmoid dural venous sinuses are
non-dominant. Diminutive and irregular appearance of portions of the
left transverse dural venous sinus.

The superior sagittal sinus, internal cerebral veins, vein of GHIMBATII,
straight sinus, right transverse sinus, sigmoid sinuses and
visualized jugular veins are patent.

There is a focally narrowed appearance at the junction of the left
transverse and sigmoid dural venous sinuses. This may reflect the
presence of a prominent arachnoid granulation at this site or a
focal stenosis.
IMPRESSION: The left transverse and sigmoid dural venous sinuses are
non-dominant. Diminutive and irregular appearance of portions of the
left transverse dural venous sinus. This may be due to developmental
hypoplasia. However, thrombus at this site is difficult to
definitively exclude. A CT venogram of the head may be obtained for
further evaluation, as clinically warranted.

Focally narrowed appearance at the junction of the left transverse
and sigmoid dural venous sinuses. This may reflect the presence of a
prominent arachnoid granulation at this site, or a focal stenosis.
Stenoses within the lateral transverse sinuses may be seen in the
setting of idiopathic intracranial hypertension (pseudotumor
cerebri).

## 2021-03-01 IMAGING — MR MR HEAD W/O CM
12 of 13 series · 44 of 48 positions shown · non-contrast
Comparison: Concurrently performed MRA head and MRV head [DATE]

CLINICAL DATA: Headache, chronic, no new features; headache,
chronic, new features or increased frequency; new onset blurry
vision right eye. Additional history provided by scanning
technologist: Patient reports worsening headaches, dizziness/blurry
vision for 2 weeks. [REDACTED] weeks pregnant.

EXAM:
MRI HEAD WITHOUT CONTRAST
TECHNIQUE: Multiplanar, multiecho pulse sequences of the brain and surrounding
structures were obtained without intravenous contrast.

[Series 5: DWI · axial · 3.0mm · 0.88mm/px · z∈[-93,+47]mm · 8 of 96 slices shown (1 of 4)]
[im 1/96]
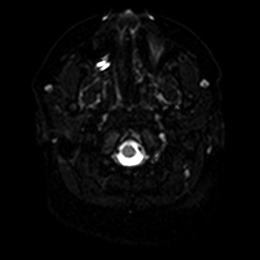
[im 14/96]
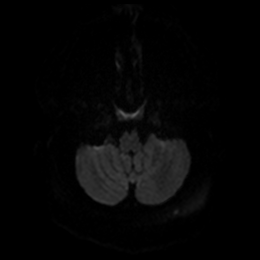
[im 28/96]
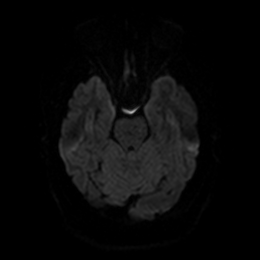
[im 41/96]
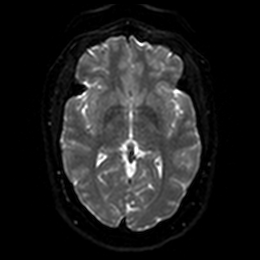
[im 55/96]
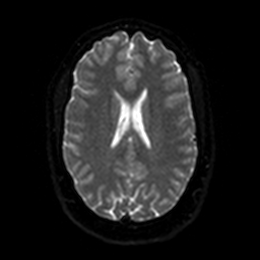
[im 68/96]
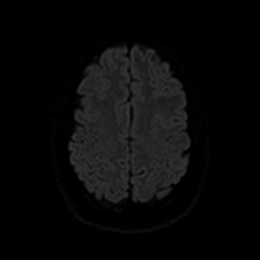
[im 82/96]
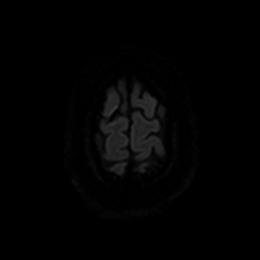
[im 96/96]
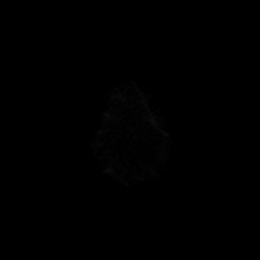

[Series 6: DWI · axial · 3.0mm · 0.88mm/px · z∈[-93,+47]mm · 4 of 48 slices shown (2 of 4)]
[im 1/48]
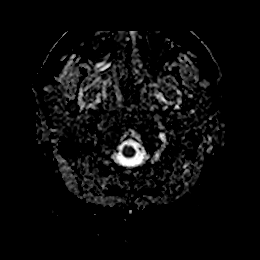
[im 16/48]
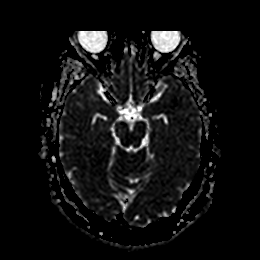
[im 32/48]
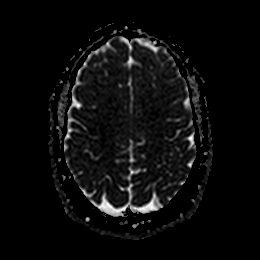
[im 48/48]
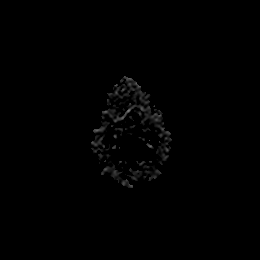

[Series 7: DWI · coronal · 4.0mm · 0.88mm/px · 6 of 72 slices shown (3 of 4)]
[im 1/72]
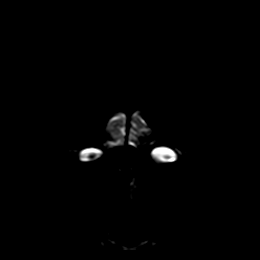
[im 15/72]
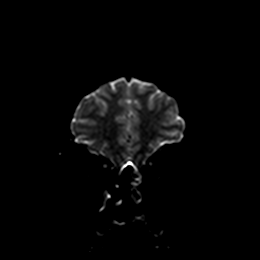
[im 29/72]
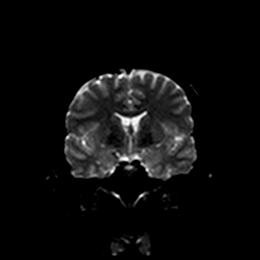
[im 43/72]
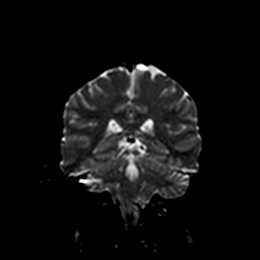
[im 57/72]
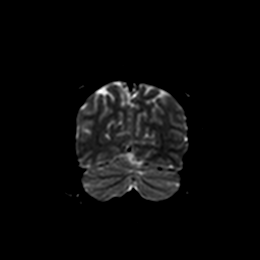
[im 72/72]
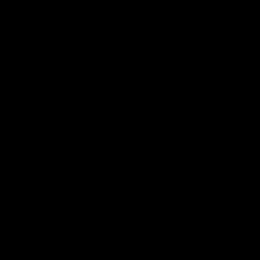

[Series 8: DWI · coronal · 4.0mm · 0.88mm/px · 3 of 36 slices shown (4 of 4)]
[im 1/36]
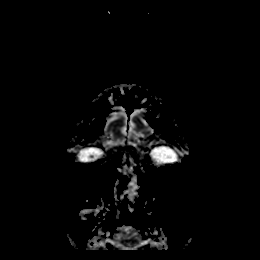
[im 18/36]
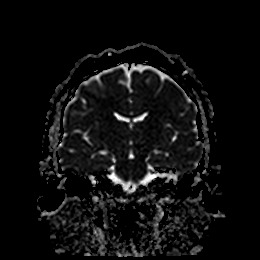
[im 36/36]
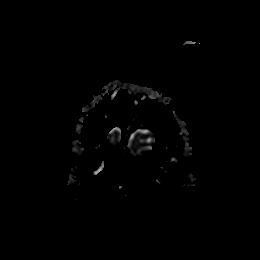

[Series 9: T1 · sagittal · 5.0mm · 0.75mm/px · 2 of 22 slices shown]
[im 1/22]
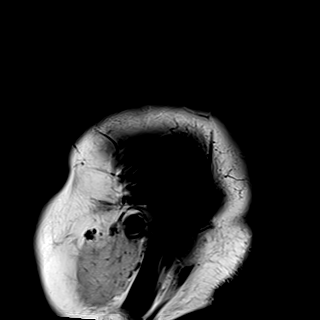
[im 22/22]
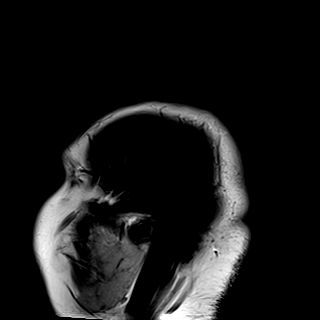

[Series 14: T2 · axial · 5.0mm · 0.72mm/px · z∈[-99,+38]mm · 2 of 24 slices shown (1 of 2)]
[im 1/24]
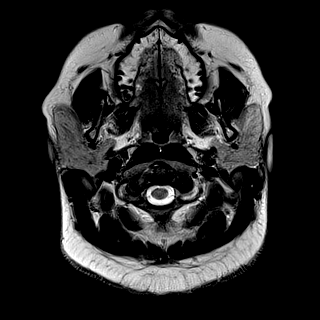
[im 24/24]
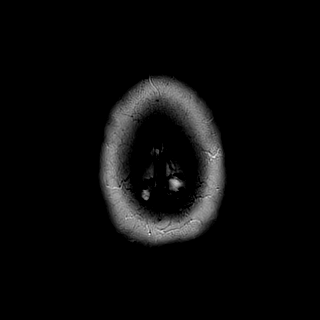

[Series 15: FLAIR · axial · 5.0mm · 0.45mm/px · z∈[-100,+37]mm · 2 of 24 slices shown]
[im 1/24]
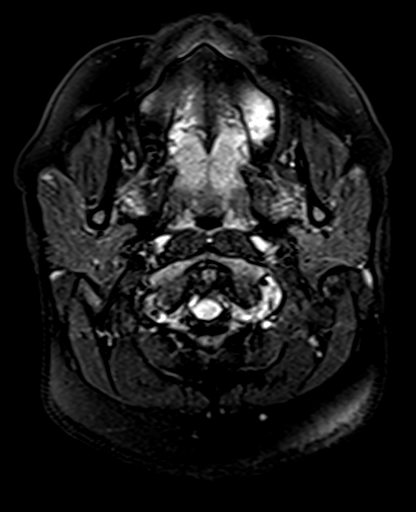
[im 24/24]
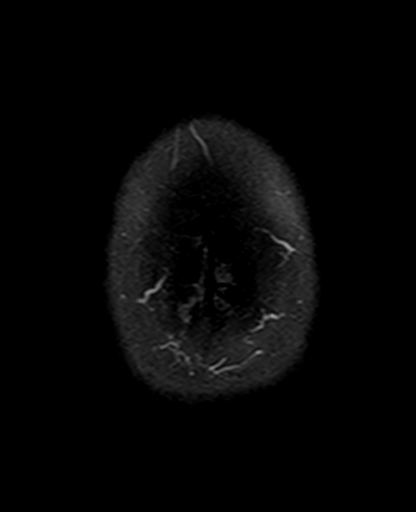

[Series 16: mag_images · axial · 3.0mm · 0.90mm/px · z∈[-101,+39]mm · 4 of 48 slices shown]
[im 1/48]
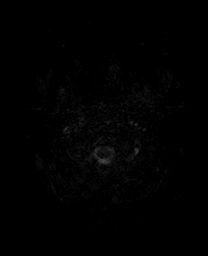
[im 16/48]
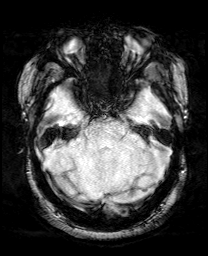
[im 32/48]
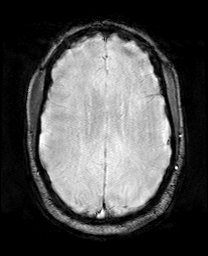
[im 48/48]
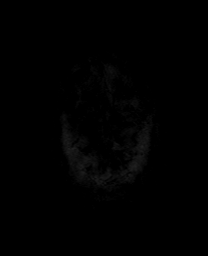

[Series 17: pha_images · axial · 3.0mm · 0.90mm/px · z∈[-101,+39]mm · 4 of 48 slices shown]
[im 1/48]
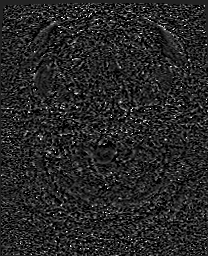
[im 16/48]
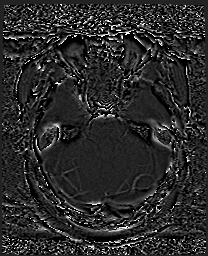
[im 32/48]
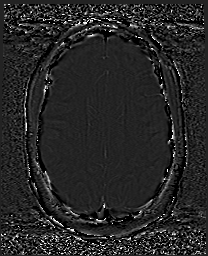
[im 48/48]
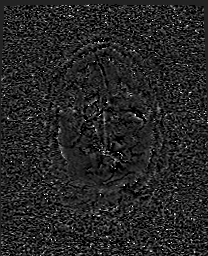

[Series 18: swi_images · axial · 3.0mm · 0.90mm/px · z∈[-101,+39]mm · 4 of 48 slices shown]
[im 1/48]
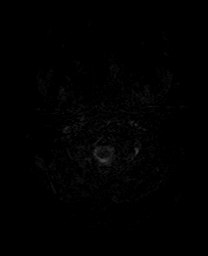
[im 16/48]
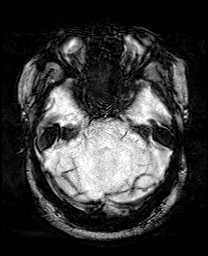
[im 32/48]
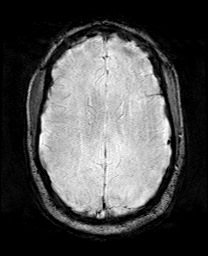
[im 48/48]
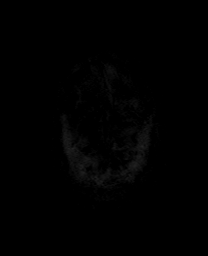

[Series 19: mip_images(sw) · axial · 24.0mm · 0.90mm/px · z∈[-91,+28]mm · 3 of 41 slices shown]
[im 1/41]
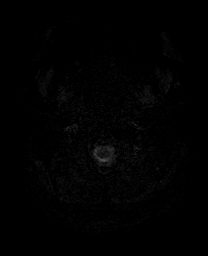
[im 21/41]
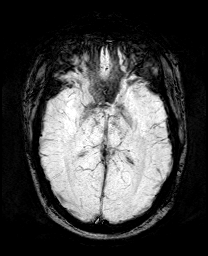
[im 41/41]
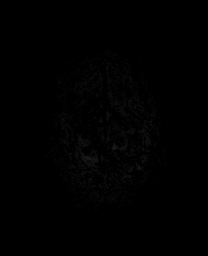

[Series 21: T2 · coronal · 5.0mm · 0.34mm/px · 2 of 28 slices shown (2 of 2)]
[im 1/28]
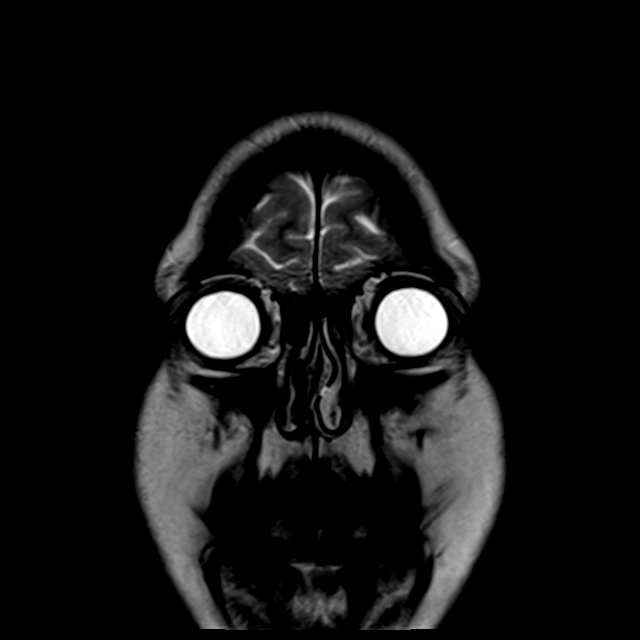
[im 28/28]
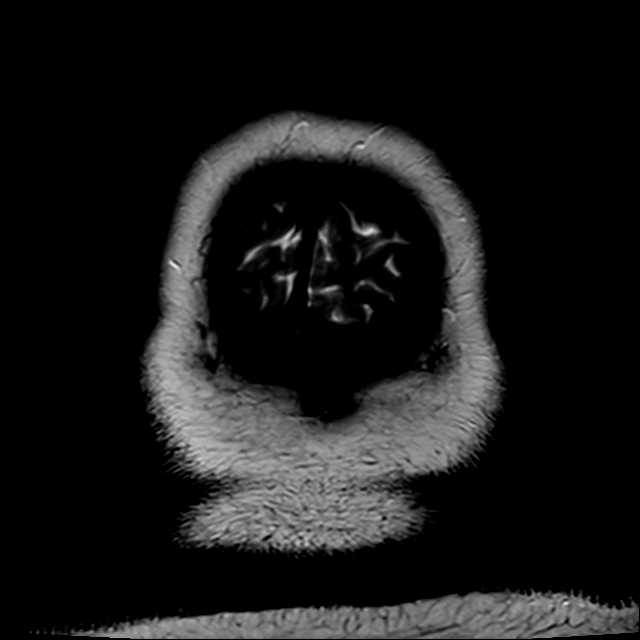

[44 of 48 positions shown; findings below may reference images not displayed]

FINDINGS: Brain:

Cerebral volume is normal.

No cortical encephalomalacia is identified. No significant cerebral
white matter disease.

There is no acute infarct.

No evidence of an intracranial mass.

No chronic intracranial blood products.

No extra-axial fluid collection.

No midline shift.

Vascular: Maintained flow voids within the proximal large arterial
vessels.

Skull and upper cervical spine: Focal suspicious marrow lesion.

Sinuses/Orbits: Visualized orbits show no acute finding. Multiple
small mucous retention cysts within the right maxillary sinus. Tiny
mucous retention cyst within the right sphenoid sinus. Trace
bilateral ethmoid sinus mucosal thickening.
IMPRESSION: Unremarkable non-contrast MRI appearance of the brain. No evidence
of acute intracranial abnormality.

Paranasal sinus disease, as described.

## 2021-03-01 MED ORDER — DIPHENHYDRAMINE HCL 50 MG/ML IJ SOLN
25.0000 mg | Freq: Once | INTRAMUSCULAR | Status: AC
  Administered 2021-03-01: 25 mg via INTRAVENOUS
  Filled 2021-03-01: qty 1

## 2021-03-01 MED ORDER — LACTATED RINGERS IV SOLN: Freq: Once | INTRAVENOUS | Status: AC

## 2021-03-01 MED ORDER — DEXAMETHASONE SODIUM PHOSPHATE 10 MG/ML IJ SOLN
INTRAMUSCULAR | Status: AC
  Filled 2021-03-01: qty 1

## 2021-03-01 MED ORDER — METOCLOPRAMIDE HCL 5 MG/ML IJ SOLN
10.0000 mg | Freq: Once | INTRAMUSCULAR | Status: AC
  Administered 2021-03-01: 10 mg via INTRAVENOUS
  Filled 2021-03-01: qty 2

## 2021-03-01 MED ORDER — DEXAMETHASONE SODIUM PHOSPHATE 10 MG/ML IJ SOLN
10.0000 mg | Freq: Once | INTRAMUSCULAR | Status: AC
  Administered 2021-03-01: 10 mg via INTRAVENOUS

## 2021-03-01 NOTE — MAU Provider Note (Signed)
History     CSN: [id number]  Arrival date and time: 03/01/21 1111   Event Date/Time   First Provider Initiated Contact with Patient 03/01/21 1159      Chief Complaint  Patient presents with   Headache   Blurred Vision   Dizziness   HPI Stephanie Burton is a 30 y.o. G2P1001 at [redacted]w[redacted]d who presents to MAU with chief complaint of headache. This is a recurrent problem, onset two weeks ago. Patient reports daily headaches since onset. She has previously attempted management with Tylenol but did not experience relief and so discontinued use. She has not attempted pain management with medication since last Thursday 02/26/2021.  Patient also c/o blurry vision in her right eye. New onset today. Blurriness is intermittent and only occurred when she was driving earlier today. She denies loss of vision. She denies neck pain, change in ROM, fever or recent illness.  Patient also c/o dizziness. This is a mild problem but recurrent. Patient denies activity intolerance, weakness, chest pain, syncope. She has only eaten cereal with milk today (as of 1205pm).   Patient denies vaginal bleeding, leaking of fluid, decreased fetal movement, fever, falls, or recent illness.   She receives care with [facility].  OB History     Gravida  2   Para  1   Term  1   Preterm      AB      Living  1      SAB      IAB      Ectopic      Multiple      Live Births  1           No past medical history on file.  No past surgical history on file.  No family history on file.  Social History   Tobacco Use   Smoking status: Never   Smokeless tobacco: Never  Substance Use Topics   Alcohol use: No    Allergies:  Allergies  Allergen Reactions   Latex Anaphylaxis    Medications Prior to Admission  Medication Sig Dispense Refill Last Dose   aspirin 81 MG chewable tablet Chew 81 mg by mouth daily.      Prenatal Vit-Fe Fumarate-FA (MULTIVITAMIN-PRENATAL) 27-0.8 MG TABS tablet Take 1 tablet  by mouth daily at 12 noon.   03/01/2021   naproxen (NAPROSYN) 500 MG tablet Take 1 tablet (500 mg total) by mouth 2 (two) times daily as needed. (Patient not taking: Reported on 03/01/2021) 30 tablet 0 Not Taking   norgestimate-ethinyl estradiol (ORTHO-CYCLEN) 0.25-35 MG-MCG tablet Take 1 tablet by mouth daily.       Review of Systems  Constitutional:  Positive for fatigue.  Eyes:  Positive for visual disturbance.  Neurological:  Positive for dizziness and headaches.  All other systems reviewed and are negative. Physical Exam   Blood pressure 127/82, pulse (!) 101, temperature 97.9 F (36.6 C), temperature source Oral, resp. rate 17, SpO2 98 %.  Physical Exam Vitals and nursing note reviewed.  Constitutional:      Appearance: She is well-developed. She is obese. She is not ill-appearing.  Eyes:     General: No visual field deficit. Cardiovascular:     Rate and Rhythm: Normal rate and regular rhythm.     Heart sounds: Normal heart sounds.  Pulmonary:     Effort: Pulmonary effort is normal.     Breath sounds: Normal breath sounds.  Abdominal:     Comments: Gravid  Skin:  General: Skin is warm and dry.     Capillary Refill: Capillary refill takes less than 2 seconds.  Neurological:     Mental Status: She is alert and oriented to person, place, and time.     Cranial Nerves: No cranial nerve deficit or facial asymmetry.     Sensory: Sensation is intact.     Motor: Motor function is intact.     Coordination: Coordination is intact.     Gait: Gait normal.  Psychiatric:        Mood and Affect: Mood normal.        Speech: Speech normal.        Behavior: Behavior normal.    MAU Course/MDM  Procedures  --Headache resolved with cocktail (Reglan, Benadryl, Decadron) --No episode of blurry vision during evaluation in MAU --Neurology consult initiated with Dr. Iver Nestle. Given lack of previous imaging will image today --Reactive tracing: baseline 150, mod var, + accels, no  decels --Toco: quiet --MRA results discussed with Dr. Iver Nestle. Pt to have outpatient Neuro consult in no more than 2 months, no further intervention indicated today  Orders Placed This Encounter  Procedures   MR ANGIO HEAD WO CONTRAST   MR MRV HEAD WO CM   MR BRAIN WO CONTRAST   Urinalysis, Routine w reflex microscopic Urine, Clean Catch   CBC   Comprehensive metabolic panel   Ambulatory referral to Neurology   Blood draw with IV start   Insert peripheral IV   Discharge patient   Patient Vitals for the past 24 hrs:  BP Temp Temp src Pulse Resp SpO2  03/01/21 1310 -- -- -- -- -- 99 %  03/01/21 1305 -- -- -- -- -- 97 %  03/01/21 1300 -- -- -- -- -- 98 %  03/01/21 1255 -- -- -- -- -- 99 %  03/01/21 1245 -- -- -- -- -- 99 %  03/01/21 1240 -- -- -- -- -- 97 %  03/01/21 1235 -- -- -- -- -- 99 %  03/01/21 1231 106/68 -- -- (!) 101 -- --  03/01/21 1230 -- -- -- -- -- 98 %  03/01/21 1225 -- -- -- -- -- 99 %  03/01/21 1220 -- -- -- -- -- 98 %  03/01/21 1216 112/64 -- -- (!) 102 -- --  03/01/21 1215 -- -- -- -- -- 98 %  03/01/21 1205 -- -- -- -- -- 98 %  03/01/21 1150 -- -- -- -- -- 97 %  03/01/21 1145 127/82 97.9 F (36.6 C) Oral (!) 101 17 98 %  03/01/21 1144 127/82 -- -- (!) 104 -- --   Results for orders placed or performed during the hospital encounter of 03/01/21 (from the past 24 hour(s))  Urinalysis, Routine w reflex microscopic Urine, Clean Catch     Status: Abnormal   Collection Time: 03/01/21 12:16 PM  Result Value Ref Range   Color, Urine AMBER (A) YELLOW   APPearance CLOUDY (A) CLEAR   Specific Gravity, Urine 1.023 1.005 - 1.030   pH 6.0 5.0 - 8.0   Glucose, UA NEGATIVE NEGATIVE mg/dL   Hgb urine dipstick NEGATIVE NEGATIVE   Bilirubin Urine NEGATIVE NEGATIVE   Ketones, ur NEGATIVE NEGATIVE mg/dL   Protein, ur 30 (A) NEGATIVE mg/dL   Nitrite NEGATIVE NEGATIVE   Leukocytes,Ua LARGE (A) NEGATIVE   RBC / HPF 0-5 0 - 5 RBC/hpf   WBC, UA 11-20 0 - 5 WBC/hpf    Bacteria, UA RARE (A) NONE SEEN   Squamous  Epithelial / LPF 21-50 0 - 5   Mucus PRESENT   CBC     Status: None   Collection Time: 03/01/21 12:16 PM  Result Value Ref Range   WBC 8.2 4.0 - 10.5 K/uL   RBC 4.61 3.87 - 5.11 MIL/uL   Hemoglobin 12.8 12.0 - 15.0 g/dL   HCT 93.8 10.1 - 75.1 %   MCV 84.6 80.0 - 100.0 fL   MCH 27.8 26.0 - 34.0 pg   MCHC 32.8 30.0 - 36.0 g/dL   RDW 02.5 85.2 - 77.8 %   Platelets 277 150 - 400 K/uL   nRBC 0.0 0.0 - 0.2 %  Comprehensive metabolic panel     Status: Abnormal   Collection Time: 03/01/21 12:16 PM  Result Value Ref Range   Sodium 135 135 - 145 mmol/L   Potassium 3.9 3.5 - 5.1 mmol/L   Chloride 107 98 - 111 mmol/L   CO2 21 (L) 22 - 32 mmol/L   Glucose, Bld 103 (H) 70 - 99 mg/dL   BUN 5 (L) 6 - 20 mg/dL   Creatinine, Ser 2.42 0.44 - 1.00 mg/dL   Calcium 9.1 8.9 - 35.3 mg/dL   Total Protein 7.1 6.5 - 8.1 g/dL   Albumin 2.9 (L) 3.5 - 5.0 g/dL   AST 14 (L) 15 - 41 U/L   ALT 11 0 - 44 U/L   Alkaline Phosphatase 57 38 - 126 U/L   Total Bilirubin 0.6 0.3 - 1.2 mg/dL   GFR, Estimated >61 >44 mL/min   Anion gap 7 5 - 15   Meds ordered this encounter  Medications   metoCLOPramide (REGLAN) injection 10 mg   diphenhydrAMINE (BENADRYL) injection 25 mg   dexamethasone (DECADRON) injection 10 mg   lactated ringers infusion   dexamethasone (DECADRON) 10 MG/ML injection    Burton, Stephanie   : cabinet override   Assessment and Plan  --30 y.o. G2P1001 at [redacted]w[redacted]d  --Reactive tracing --S/p Neuro consult and imaging --Headache resolved  --Reviewed nutrition guidelines in late second trimester --Discharge home in stable condition  Calvert Cantor, CNM 03/01/2021, 5:53 PM

## 2021-03-01 NOTE — MAU Note (Signed)
Pt reports to mau with c/o headache for the past 2 weeks with new onset of blurred vision in right eye.  Pt states she last took tylenol on Thursday.  Reports some dizziness today.  Meal recall: cereal and toast Denies LOF, CTX or vag bleeding.  +FM

## 2021-04-30 ENCOUNTER — Other Ambulatory Visit: Payer: Self-pay

## 2021-04-30 ENCOUNTER — Encounter (HOSPITAL_COMMUNITY): Payer: Self-pay | Admitting: Obstetrics and Gynecology

## 2021-04-30 ENCOUNTER — Observation Stay (HOSPITAL_COMMUNITY)
Admission: AD | Admit: 2021-04-30 | Discharge: 2021-05-01 | Disposition: A | Discharge status: Home / Self Care | Payer: No Typology Code available for payment source | Attending: Obstetrics and Gynecology | Admitting: Obstetrics and Gynecology

## 2021-04-30 DIAGNOSIS — Z20822 Contact with and (suspected) exposure to covid-19: Secondary | ICD-10-CM | POA: Diagnosis not present

## 2021-04-30 DIAGNOSIS — O283 Abnormal ultrasonic finding on antenatal screening of mother: Secondary | ICD-10-CM | POA: Diagnosis present

## 2021-04-30 DIAGNOSIS — Z3A35 35 weeks gestation of pregnancy: Secondary | ICD-10-CM | POA: Insufficient documentation

## 2021-04-30 LAB — CBC
HCT: 35.8 % — ABNORMAL LOW (ref 36.0–46.0)
Hemoglobin: 11.4 g/dL — ABNORMAL LOW (ref 12.0–15.0)
MCH: 26.3 pg (ref 26.0–34.0)
MCHC: 31.8 g/dL (ref 30.0–36.0)
MCV: 82.7 fL (ref 80.0–100.0)
Platelets: 247 10*3/uL (ref 150–400)
RBC: 4.33 MIL/uL (ref 3.87–5.11)
RDW: 12.9 % (ref 11.5–15.5)
WBC: 7 10*3/uL (ref 4.0–10.5)
nRBC: 0 % (ref 0.0–0.2)

## 2021-04-30 LAB — TYPE AND SCREEN
ABO/RH(D): A POS
Antibody Screen: NEGATIVE

## 2021-04-30 LAB — RESP PANEL BY RT-PCR (FLU A&B, COVID) ARPGX2
Influenza A by PCR: NEGATIVE
Influenza B by PCR: NEGATIVE
SARS Coronavirus 2 by RT PCR: NEGATIVE

## 2021-04-30 MED ORDER — SODIUM CHLORIDE 0.9% FLUSH: 3.0000 mL | INTRAVENOUS | Status: DC | PRN

## 2021-04-30 MED ORDER — PRENATAL MULTIVITAMIN CH
1.0000 | ORAL_TABLET | Freq: Every day | ORAL | Status: DC
  Administered 2021-05-01: 1 via ORAL
  Filled 2021-04-30 (×2): qty 1

## 2021-04-30 MED ORDER — DOCUSATE SODIUM 100 MG PO CAPS
100.0000 mg | ORAL_CAPSULE | Freq: Every day | ORAL | Status: DC
  Filled 2021-04-30 (×2): qty 1

## 2021-04-30 MED ORDER — SODIUM CHLORIDE 0.9% FLUSH
3.0000 mL | Freq: Two times a day (BID) | INTRAVENOUS | Status: DC
  Administered 2021-04-30 – 2021-05-01 (×2): 3 mL via INTRAVENOUS

## 2021-04-30 MED ORDER — SODIUM CHLORIDE 0.9 % IV SOLN: 250.0000 mL | INTRAVENOUS | Status: DC | PRN

## 2021-04-30 MED ORDER — CALCIUM CARBONATE ANTACID 500 MG PO CHEW
2.0000 | CHEWABLE_TABLET | ORAL | Status: DC | PRN
  Administered 2021-04-30: 400 mg via ORAL
  Filled 2021-04-30: qty 2

## 2021-04-30 MED ORDER — ZOLPIDEM TARTRATE 5 MG PO TABS: 5.0000 mg | ORAL_TABLET | Freq: Every evening | ORAL | Status: DC | PRN

## 2021-04-30 MED ORDER — ACETAMINOPHEN 325 MG PO TABS: 650.0000 mg | ORAL_TABLET | ORAL | Status: DC | PRN

## 2021-04-30 MED ORDER — BETAMETHASONE SOD PHOS & ACET 6 (3-3) MG/ML IJ SUSP
12.0000 mg | INTRAMUSCULAR | Status: AC
  Administered 2021-04-30 – 2021-05-01 (×2): 12 mg via INTRAMUSCULAR
  Filled 2021-04-30: qty 5

## 2021-04-30 NOTE — Progress Notes (Signed)
Since patient has arrived to hospital, NST is reactive. Baseline 145-150bpm with moderate variability, 15x15 accelerations, no decelerations. No contractions on toco.   BPP score 6/10 (off breathing and tone). Spoke with Dr. Grace Bushy (MFM) who recommends NST tonight with plan for repeat BPP 24 hours after first (ordered for tomorrow at 1130).   Call was made to primary RN and patient via Vocera. Discussed plan of care which patient verbalizes understanding and agreement. NSTs qshift ordered.   Steva Ready, DO

## 2021-04-30 NOTE — H&P (Addendum)
HPI: 30 y.o. G2P1001 @ [redacted]w[redacted]d estimated gestational age (as dated by 6 week ultrasound) presents for continuous fetal monitoring for 4/8 BPP (off breathing, tone).  Leakage of fluid:  No Vaginal bleeding:  No Contractions:  No Fetal movement:  Yes  Prenatal care has been provided by Dr. Steva Ready [facility])  ROS:  Denies fevers, chills, chest pain, visual changes, SOB, RUQ/epigastric pain, N/V, dysuria, hematuria, or sudden onset/worsening bilateral LE or facial edema.  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, has not been discussed with patient) Desires permanent sterilization (bilateral salpingectomy)  Prenatal Transfer Tool  Maternal Diabetes: No Genetic Screening: Normal Maternal Ultrasounds/Referrals: Other: Fetal Ultrasounds or other Referrals:  None Maternal Substance Abuse:  No Significant Maternal Medications:  None Significant Maternal Lab Results: None   Prenatal Labs Blood type:  A Positive Antibody screen:  Negative CBC:  H/H 11.7/35 Rubella: Immune RPR:  Non-reactive Hep B:  Negative Hep C:  Negative HIV:  Negative GC/CT:  Negative Glucola:  94.6 (wnl)  Immunizations: Tdap: Given prenatally Flu: Received  OBHx:  OB History     Gravida  2   Para  1   Term  1   Preterm      AB      Living  1      SAB      IAB      Ectopic      Multiple      Live Births  1          PMHx:  See above Meds:  PNV Allergy:   Allergies  Allergen Reactions   Latex Anaphylaxis   SurgHx: History of cesarean section SocHx:   Denies Tobacco, ETOH, illicit drugs  O: There were no vitals taken for this visit. Gen. AAOx3, NAD CV.  RRR  Resp. CTAB, no wheezes/rales/rhonchi Abd. Gravid, soft, non-tender throughout, no rebound/guarding Extr.  No bilateral LE edema, no calf tenderness bilaterally  Last [place] (04/30/21): EFW 3557D (72%), AFI 13.47cm, cephalic,  anterior placenta, BPP 4/8 (off for breathing and tone). Technically difficult study with limited/poor visualization due to maternal body  habitus. Fetal measures not technically accurate. Difficult to measure amniotic fluid pockets.    Labs: see orders  A/P:  30 y.o. G2P1001 @ [redacted]w[redacted]d who presents for continuous fetal monitoring for 4/8 BPP (off breathing/tone).  Non-reassuring fetal testing - Admit to [facility] - Admit labs (CBC, T&S, COVID screen) - CEFM/Toco - FWB:  4/8 BPP (off breathing/tone) - Diet:  Regular - IVF:  SLIV - VTE Prophylaxis:  SCDs - GBS Status:  Unknown - Presentation:  Cephalic - MFM Consultation placed - BMZ ordered for FLM  History of cesarean section x 1 - Performed at [redacted]w[redacted]d for oligo and gHTN (see notes in Care Everywhere) - Desires repeat - Performed in [place], [place] for arrest of dilation and NRFHT  Obesity (BMI 42) - SCDs while in bed  PCOS - Early HgbA1C 5.5 (8/9)  HSV2 - Will address wit patient - noted in Epic EHR problem list  Desires permanent sterilization - Discussed performing bilateral salpingectomy at the time of delivery  Steva Ready, DO [phone] (office)

## 2021-05-01 ENCOUNTER — Encounter (HOSPITAL_COMMUNITY): Payer: Self-pay | Admitting: Obstetrics and Gynecology

## 2021-05-01 ENCOUNTER — Observation Stay (HOSPITAL_BASED_OUTPATIENT_CLINIC_OR_DEPARTMENT_OTHER): Payer: No Typology Code available for payment source

## 2021-05-01 DIAGNOSIS — E669 Obesity, unspecified: Secondary | ICD-10-CM | POA: Diagnosis not present

## 2021-05-01 DIAGNOSIS — O99213 Obesity complicating pregnancy, third trimester: Secondary | ICD-10-CM | POA: Diagnosis not present

## 2021-05-01 DIAGNOSIS — O283 Abnormal ultrasonic finding on antenatal screening of mother: Secondary | ICD-10-CM | POA: Diagnosis not present

## 2021-05-01 DIAGNOSIS — O34219 Maternal care for unspecified type scar from previous cesarean delivery: Secondary | ICD-10-CM

## 2021-05-01 DIAGNOSIS — Z3A35 35 weeks gestation of pregnancy: Secondary | ICD-10-CM

## 2021-05-01 IMAGING — US US MFM FETAL BPP W/O NON-STRESS
1 series · 12 of 28 positions shown · non-contrast
Comparison: none

[Series 1: us mfm fetal bpp w/o non-stress · 31 acquisitions, 12 frames shown]
[im 2/31]
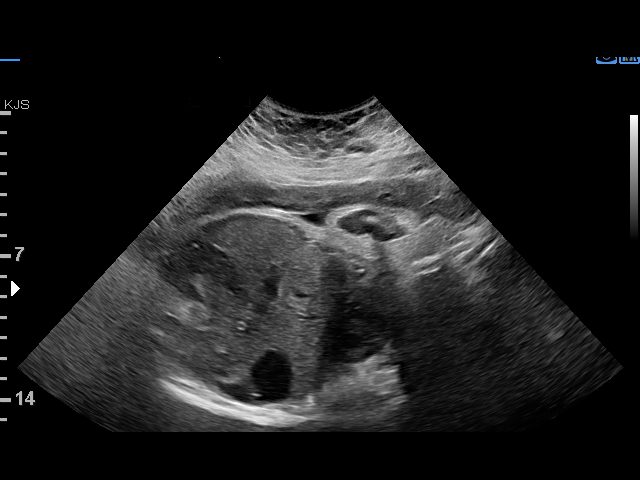
[im 4/31]
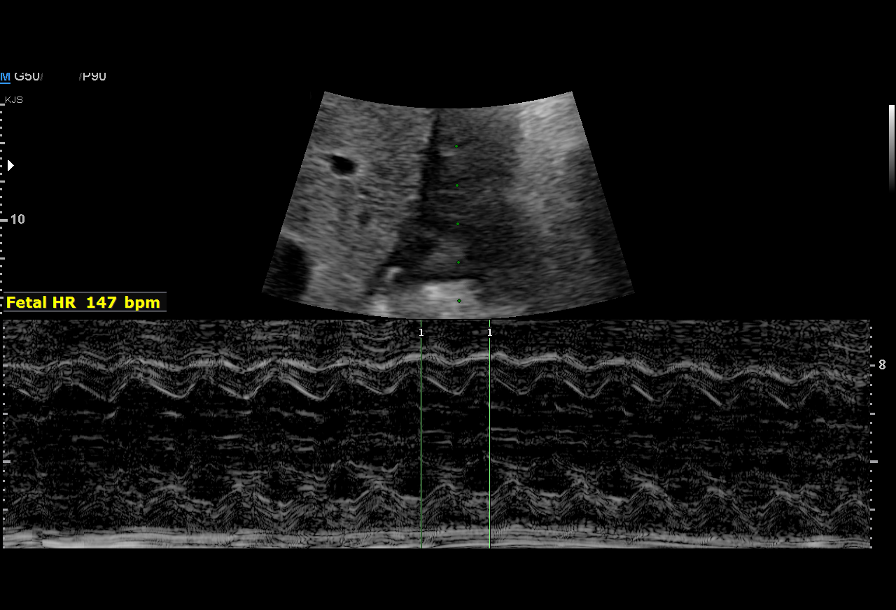
[im 6/31]
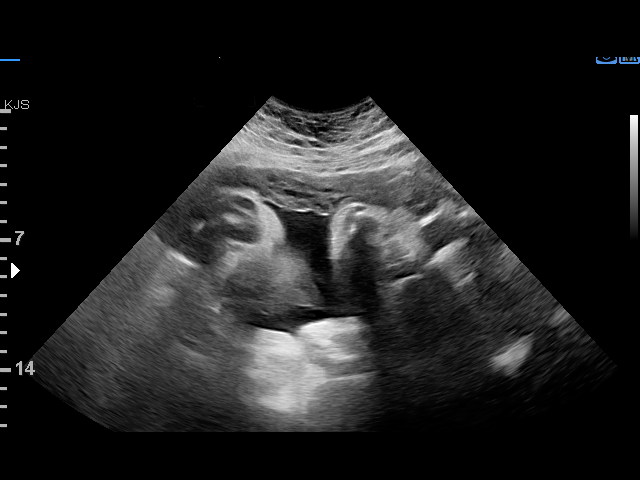
[im 9/31]
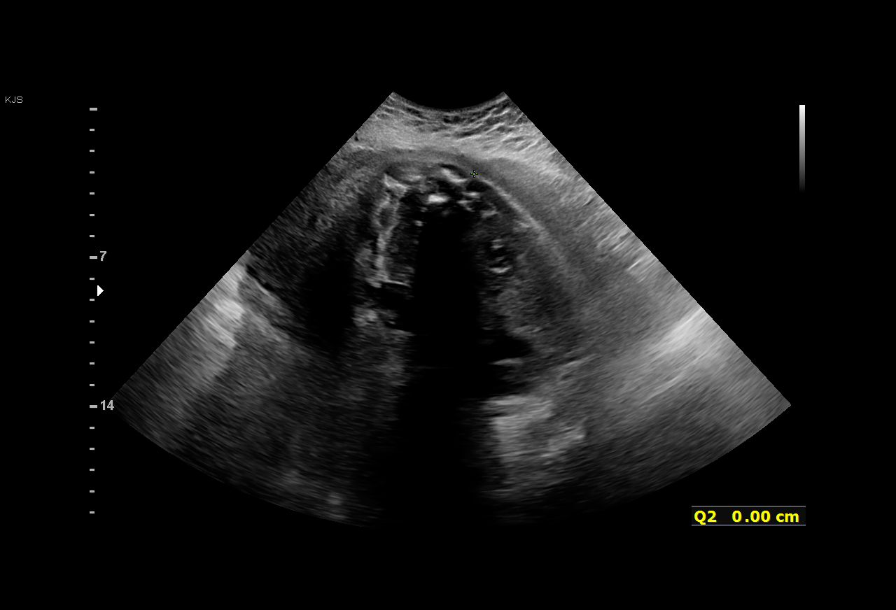
[im 12/31]
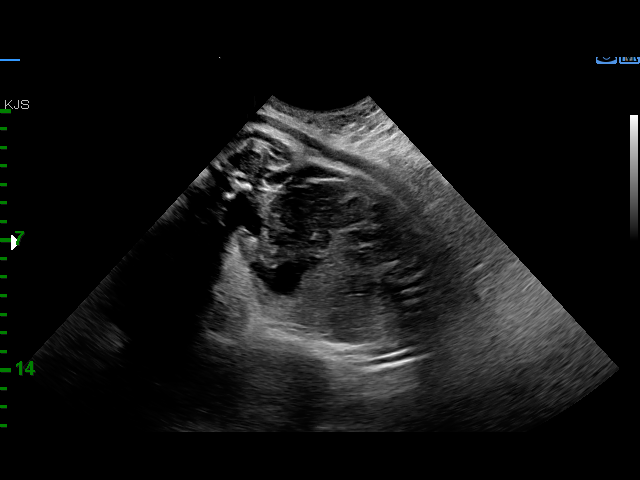
[im 14/31]
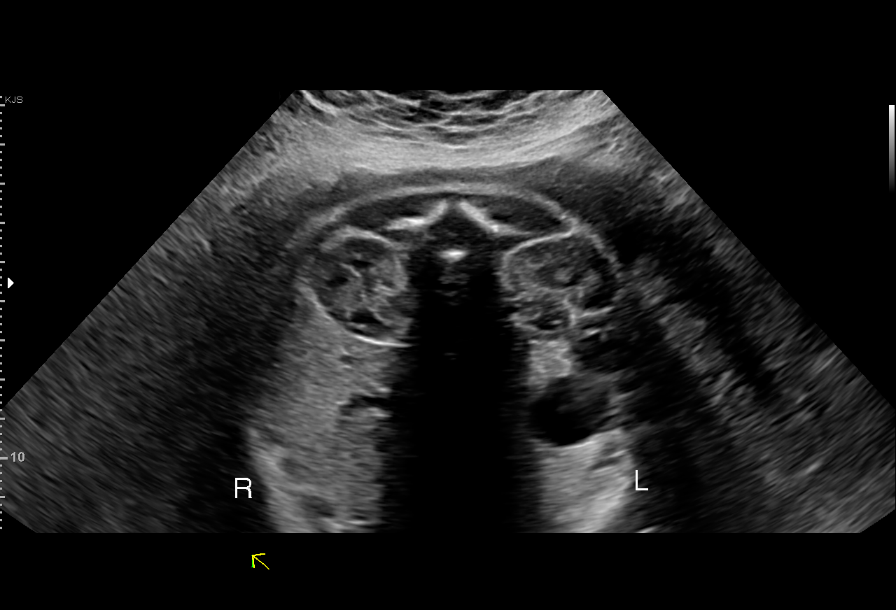
[im 17/31]
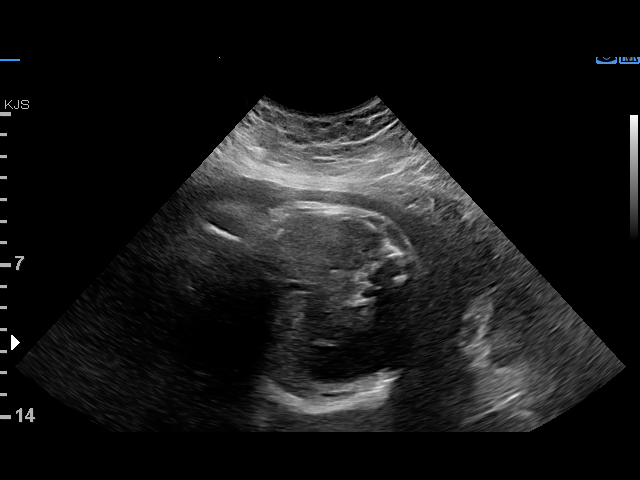
[im 19/31]
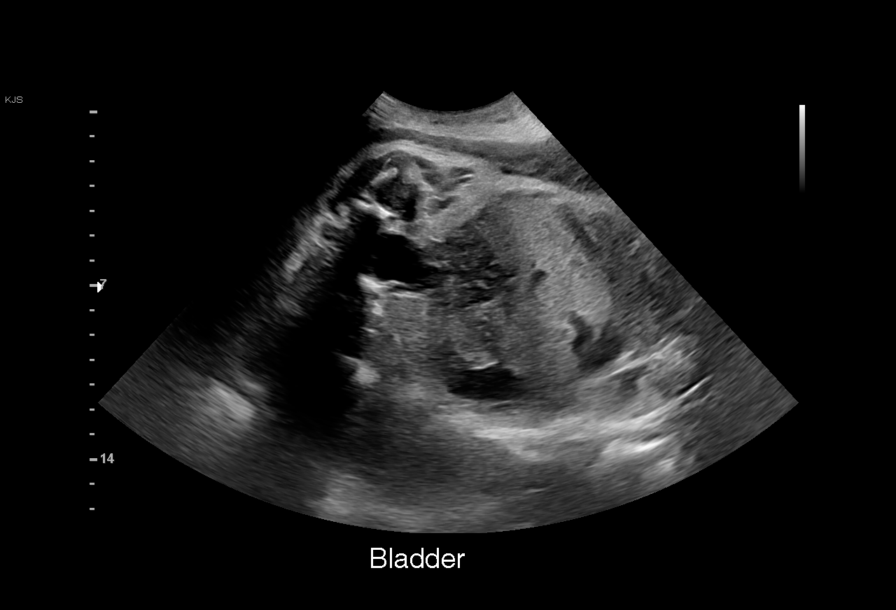
[im 22/31]
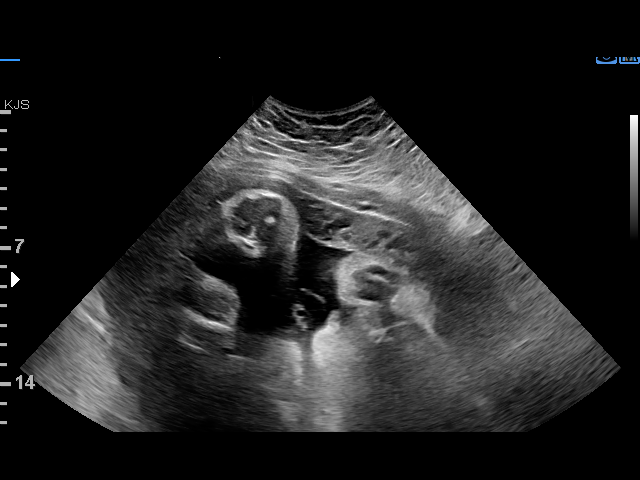
[im 25/31]
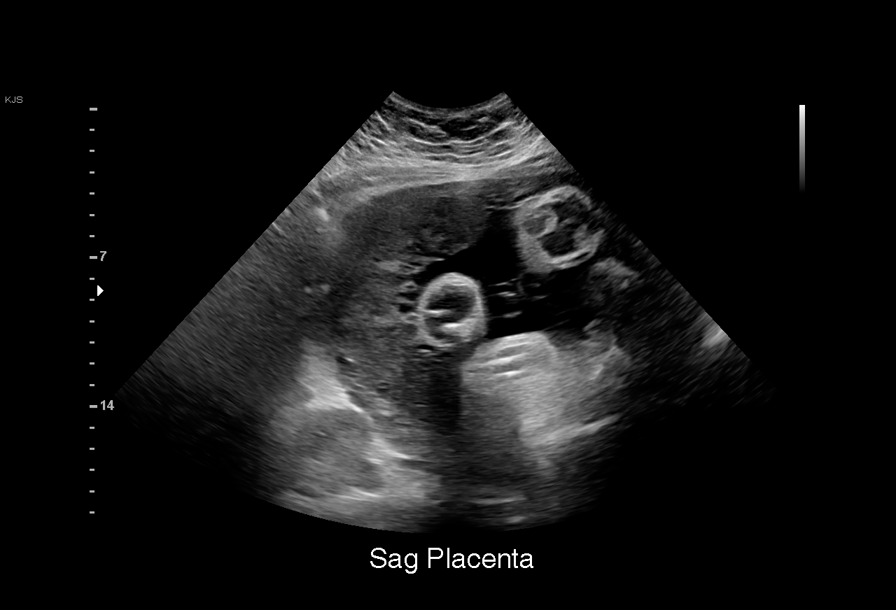
[im 27/31]
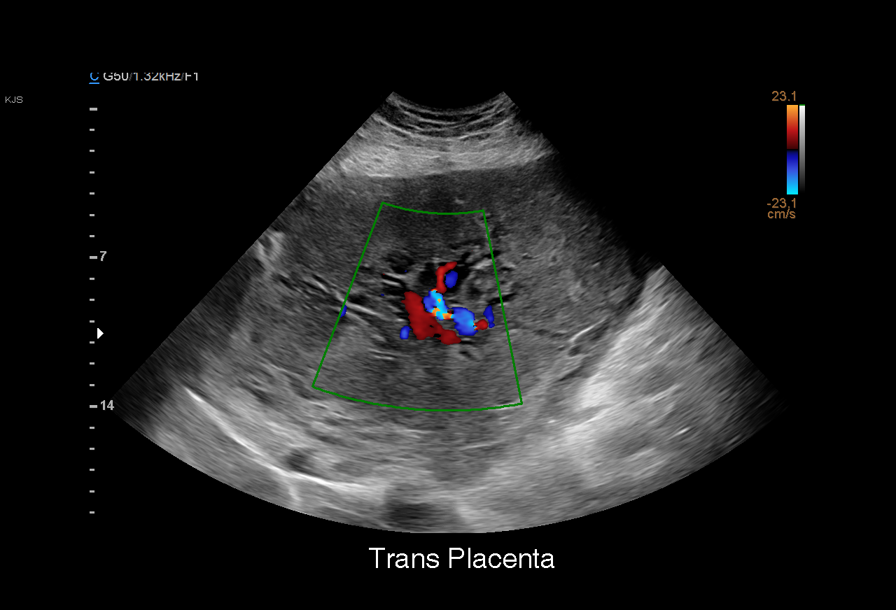
[im 29/31]
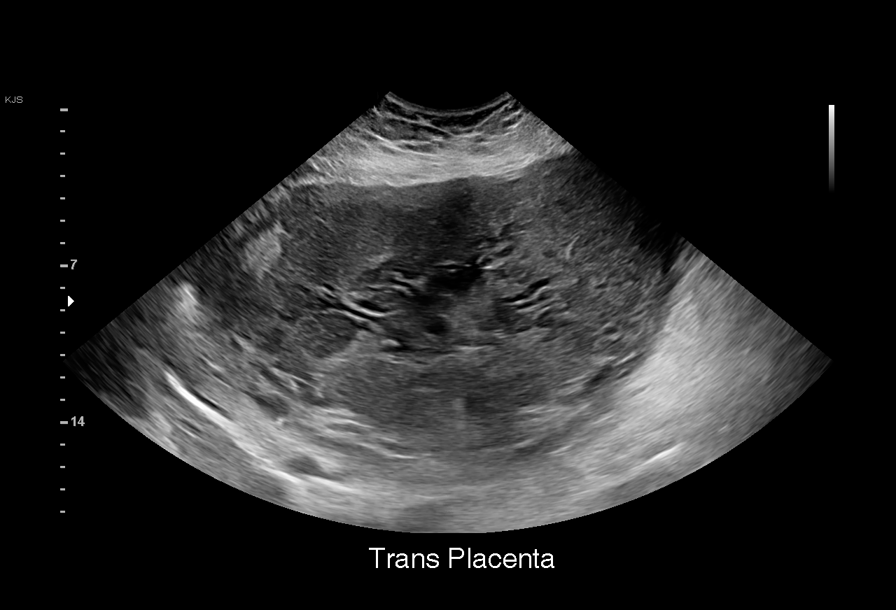

[12 of 28 positions shown; findings below may reference images not displayed]

[REDACTED]
                                                            Ave[address] [HOSPITAL]

Indications

 Obesity complicating pregnancy, third          [PE]
 trimester (BMI 50)
 Previous cesarean delivery, antepartum         [PE]
 35 weeks gestation of pregnancy
Fetal Evaluation

 Num Of Fetuses:         1
 Fetal Heart Rate(bpm):  147
 Cardiac Activity:       Observed
 Presentation:           Cephalic
 Placenta:               Fundal
 P. Cord Insertion:      Visualized, central

 Amniotic Fluid
 AFI FV:      Within normal limits

 AFI Sum(cm)     %Tile       Largest Pocket(cm)
 10.09           22

 RUQ(cm)       RLQ(cm)       LUQ(cm)        LLQ(cm)
 4.76          1.98          0
Biophysical Evaluation

 Amniotic F.V:   Pocket => 2 cm             F. Tone:        Observed
 F. Movement:    Observed                   Score:          [DATE]
 F. Breathing:   Observed
OB History

 Gravidity:    2         Term:   1
 Living:       1
Gestational Age

 Clinical EDD:  35w 3d                                        EDD:   [DATE]
 Best:          35w 3d     Det. By:  Clinical EDD             EDD:   [DATE]
Anatomy

 Diaphragm:             Appears normal         Kidneys:                Appear normal
 Stomach:               Appears normal, left   Bladder:                Appears normal
                        sided
Impression

 Antenatal testing performed given maternal elevated BMI with
 a failed BPP of [DATE] with a reactive NST
 The biophysical profile was [DATE] with good fetal movement and
 amniotic fluid volume.

Recommendations

 Clinical correlation recommended.

## 2021-05-01 NOTE — Plan of Care (Signed)

## 2021-05-01 NOTE — Discharge Summary (Signed)
Physician Discharge Summary  Patient ID: Stephanie Burton MRN: [id number] DOB/AGE: [birth year] 30 y.o.  Admit date: 04/30/2021 Discharge date: 05/01/2021  Admission Diagnoses:35 weeks and 2 days nonreassuring fetal testing   Discharge Diagnoses: 35 weeks and 3 days  Principal Problem:   Abnormal fetal ultrasound   Discharged Condition: stable  Hospital Course: pt was admitted on 04/30/2021 due to bpp in the office of 6 out of 10... BPP was repeated on 05/01/2021 and was 8/8 pt is discharged home   Consults: None  Significant Diagnostic Studies: [place] for bpp 8/8   Treatments: IV hydration and betamethasone for fetal lung maturity   Discharge Exam: Blood pressure 132/71, pulse (!) 111, temperature (!) 97.4 F (36.3 C), temperature source Oral, resp. rate 18, height 5\' 9"  (1.753 m), weight (!) 156 kg, SpO2 95 %. General appearance: alert, cooperative, and no distress Resp: no distress  GI: gravid nontender  Extremities: extremities normal, atraumatic, no cyanosis or edema FHT nst reactive   Disposition: Discharge disposition: 01-Home or Self Care       Discharge Instructions     Discharge activity:  No Restrictions   Complete by: As directed    Fetal Kick Count:  Lie on our left side for one hour after a meal, and count the number of times your baby kicks.  If it is less than 5 times, get up, move around and drink some juice.  Repeat the test 30 minutes later.  If it is still less than 5 kicks in an hour, notify your doctor.   Complete by: As directed    LABOR:  When conractions begin, you should start to time them from the beginning of one contraction to the beginning  of the next.  When contractions are 5 - 10 minutes apart or less and have been regular for at least an hour, you should call your health care provider.   Complete by: As directed    No sexual activity restrictions   Complete by: As directed    Notify physician for bleeding from the vagina   Complete by: As  directed    Notify physician for blurring of vision or spots before the eyes   Complete by: As directed    Notify physician for chills or fever   Complete by: As directed    Notify physician for fainting spells, "black outs" or loss of consciousness   Complete by: As directed    Notify physician for increase in vaginal discharge   Complete by: As directed    Notify physician for leaking of fluid   Complete by: As directed    Notify physician for pain or burning when urinating   Complete by: As directed    Notify physician for pelvic pressure (sudden increase)   Complete by: As directed    Notify physician for severe or continued nausea or vomiting   Complete by: As directed    Notify physician for sudden gushing of fluid from the vagina (with or without continued leaking)   Complete by: As directed    Notify physician for sudden, constant, or occasional abdominal pain   Complete by: As directed    Notify physician if baby moving less than usual   Complete by: As directed       Allergies as of 05/01/2021       Reactions   Latex Anaphylaxis        Medication List     TAKE these medications    multivitamin-prenatal 27-0.8 MG Tabs  tablet Take 1 tablet by mouth daily at 12 noon.        Follow-up Information     Steva Ready, DO. Go to.   Specialty: Obstetrics and Gynecology Why: please keep your  scheduled appointment with Dr. Ollen Gross information: [address] [place] [address] [phone]                 Signed: Gerald Leitz 05/01/2021, 1:12 PM

## 2021-05-04 ENCOUNTER — Encounter (HOSPITAL_COMMUNITY): Payer: Self-pay | Admitting: Obstetrics and Gynecology

## 2021-05-04 ENCOUNTER — Inpatient Hospital Stay (HOSPITAL_BASED_OUTPATIENT_CLINIC_OR_DEPARTMENT_OTHER): Payer: No Typology Code available for payment source

## 2021-05-04 ENCOUNTER — Inpatient Hospital Stay (HOSPITAL_COMMUNITY)
Admission: AD | Admit: 2021-05-04 | Discharge: 2021-05-04 | Disposition: A | Discharge status: Home / Self Care | Payer: No Typology Code available for payment source | Attending: Obstetrics and Gynecology | Admitting: Obstetrics and Gynecology

## 2021-05-04 DIAGNOSIS — O34219 Maternal care for unspecified type scar from previous cesarean delivery: Secondary | ICD-10-CM

## 2021-05-04 DIAGNOSIS — O99213 Obesity complicating pregnancy, third trimester: Secondary | ICD-10-CM

## 2021-05-04 DIAGNOSIS — O288 Other abnormal findings on antenatal screening of mother: Secondary | ICD-10-CM

## 2021-05-04 DIAGNOSIS — Z3A35 35 weeks gestation of pregnancy: Secondary | ICD-10-CM | POA: Diagnosis not present

## 2021-05-04 DIAGNOSIS — E669 Obesity, unspecified: Secondary | ICD-10-CM | POA: Diagnosis not present

## 2021-05-04 DIAGNOSIS — N858 Other specified noninflammatory disorders of uterus: Secondary | ICD-10-CM | POA: Insufficient documentation

## 2021-05-04 DIAGNOSIS — Z3689 Encounter for other specified antenatal screening: Secondary | ICD-10-CM | POA: Insufficient documentation

## 2021-05-04 HISTORY — DX: Polycystic ovarian syndrome: E28.2

## 2021-05-04 HISTORY — DX: Urinary tract infection, site not specified: N39.0

## 2021-05-04 HISTORY — DX: Headache, unspecified: R51.9

## 2021-05-04 HISTORY — DX: Dermatitis, unspecified: L30.9

## 2021-05-04 IMAGING — US US MFM FETAL BPP W/O NON-STRESS
1 series · 15 of 26 positions shown · non-contrast
Comparison: none

[Series 1: us mfm fetal bpp w/o non-stress · 26 acquisitions, 15 frames shown]
[im 1/26]
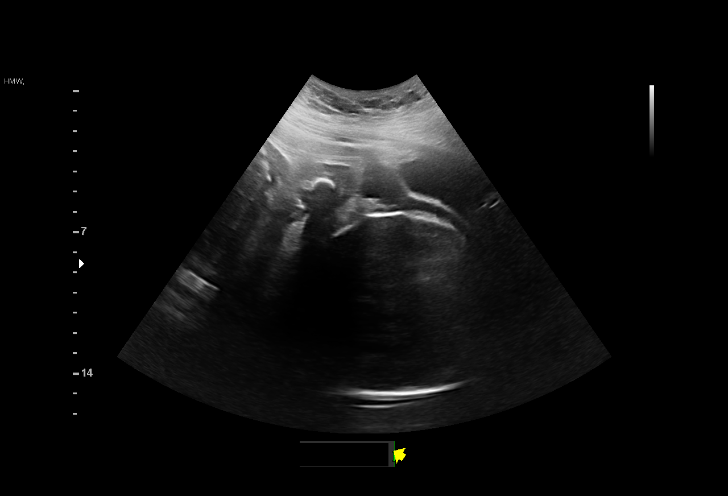
[im 3/26]
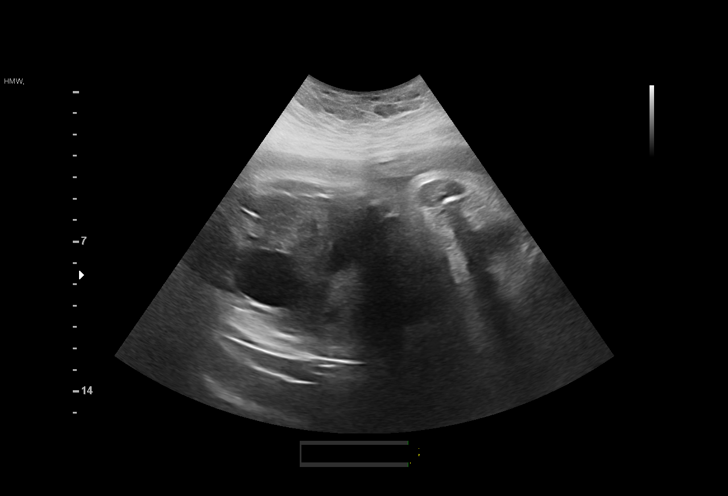
[im 5/26]
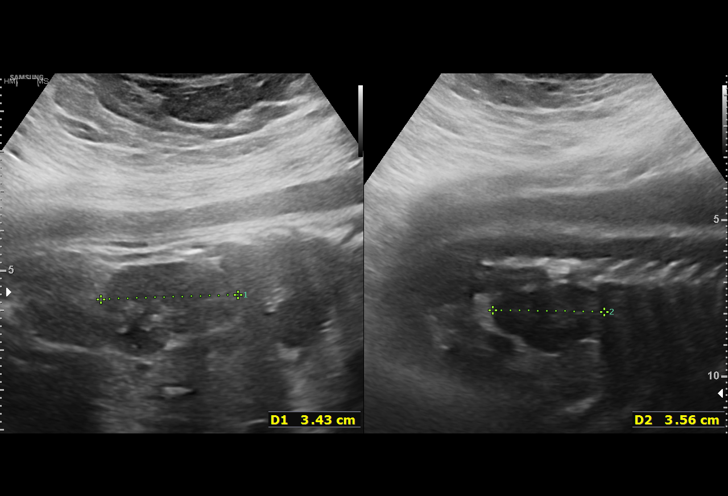
[im 7/26]
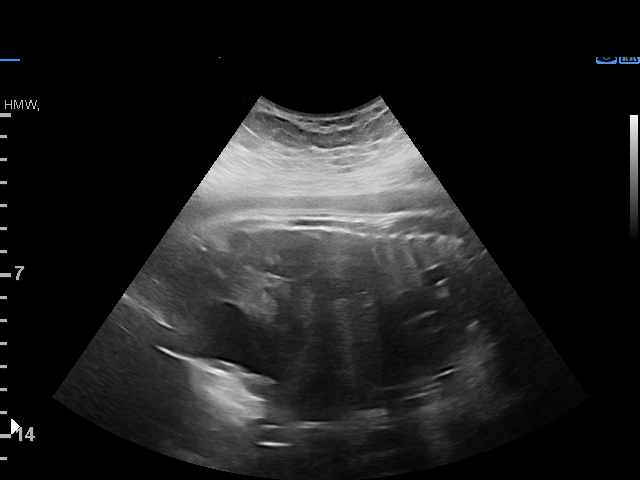
[im 8/26]
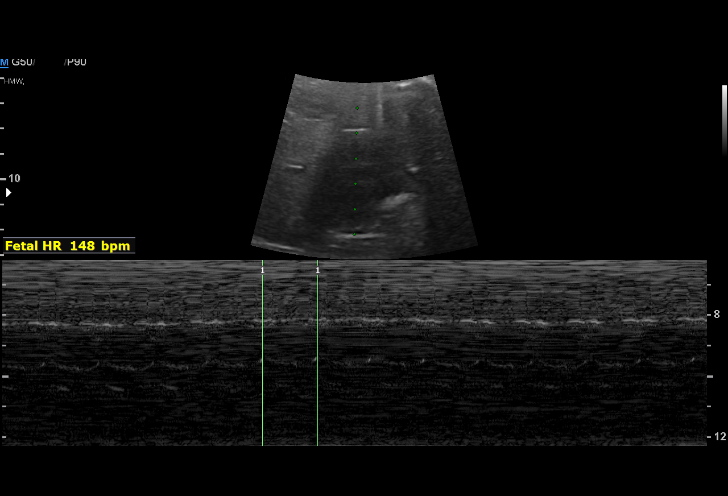
[im 10/26]
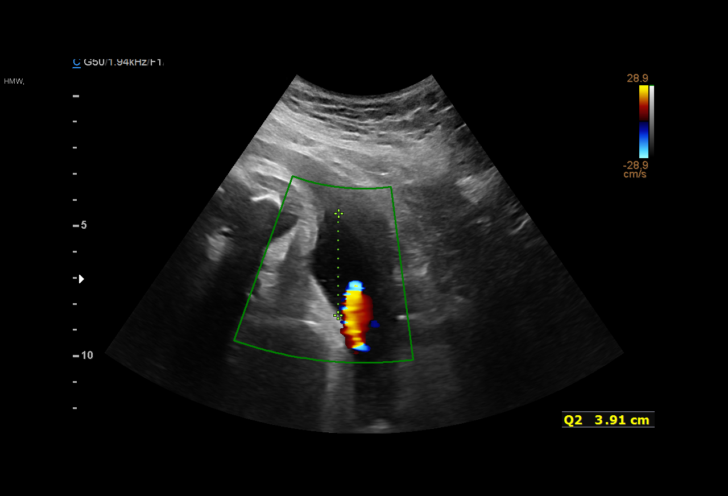
[im 12/26]
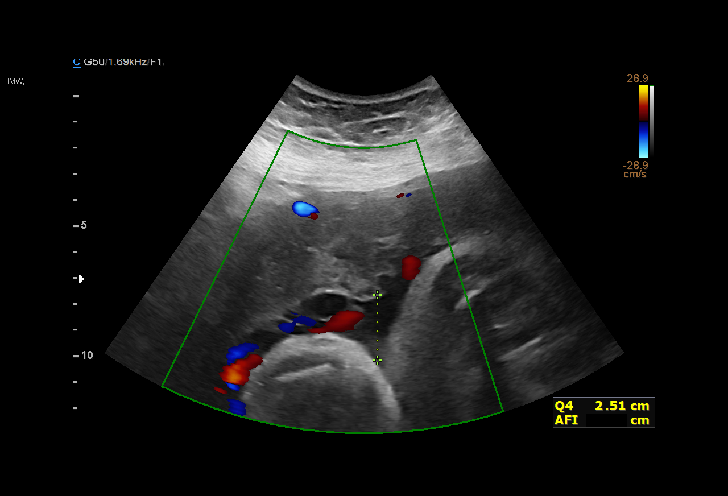
[im 14/26]
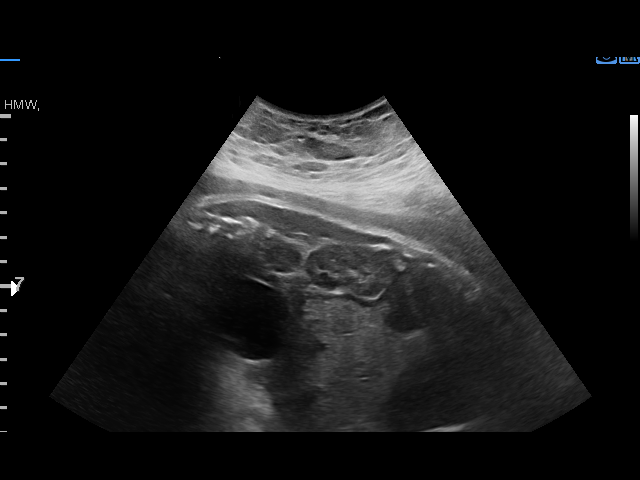
[im 15/26]
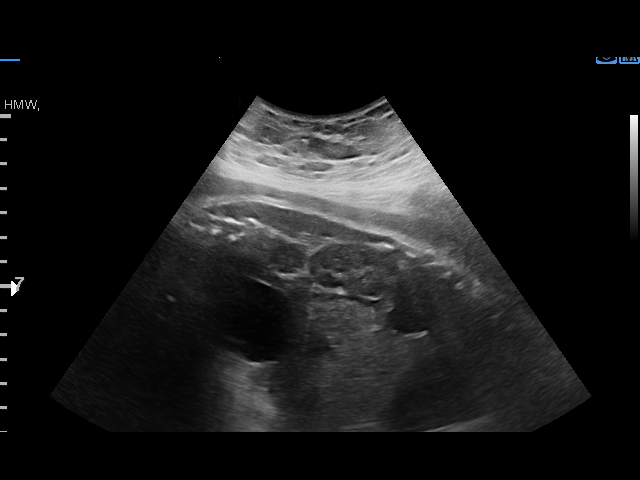
[im 17/26]
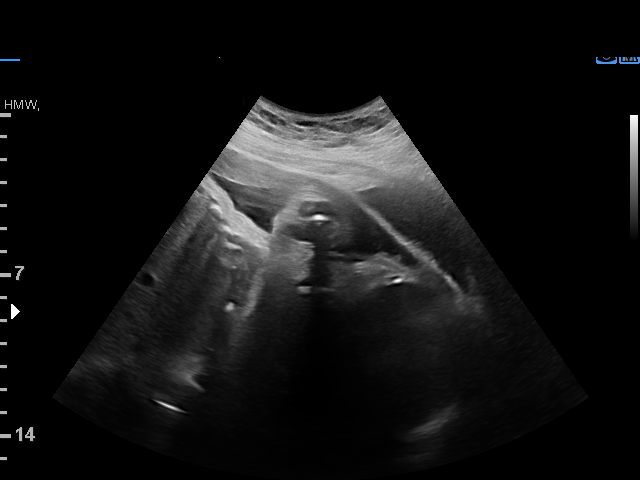
[im 19/26]
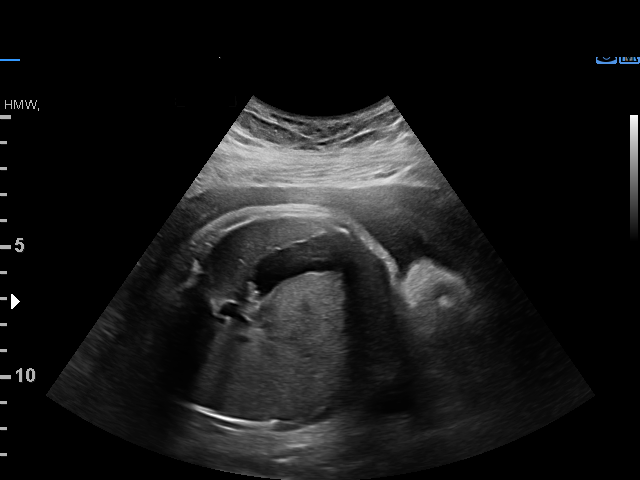
[im 20/26]
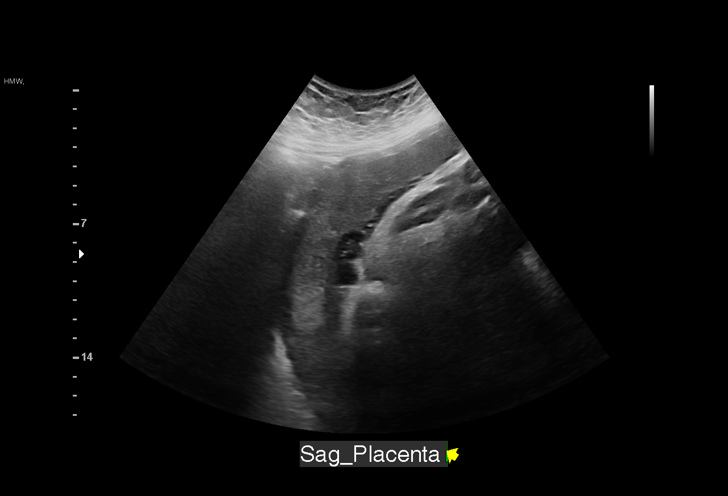
[im 22/26]
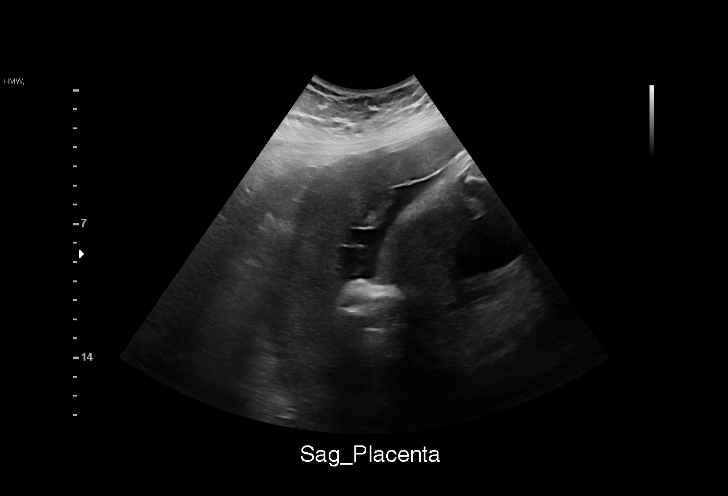
[im 24/26]
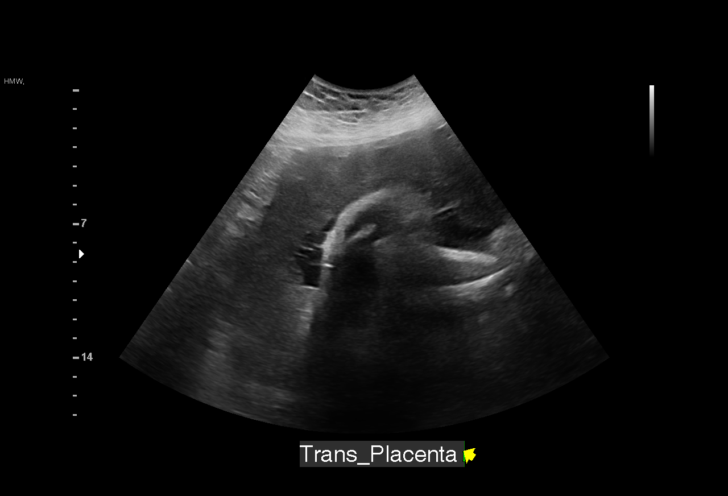
[im 26/26]
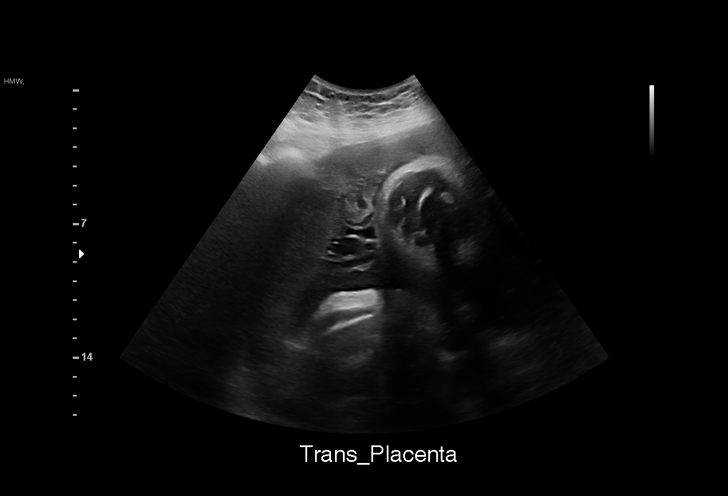

[15 of 26 positions shown; findings below may reference images not displayed]

Indications

 Non-reactive NST                                [6H]
 35 weeks gestation of pregnancy
 Obesity complicating pregnancy, third           [6H]
 trimester (BMI 50)
 Previous cesarean delivery, antepartum          [6H]
Fetal Evaluation

 Num Of Fetuses:          1
 Fetal Heart Rate(bpm):   148
 Cardiac Activity:        Observed
 Presentation:            Cephalic
 Placenta:                Fundal

 Amniotic Fluid
 AFI FV:      Within normal limits

 AFI Sum(cm)     %Tile       Largest Pocket(cm)
 12.5            40

 RUQ(cm)       RLQ(cm)       LUQ(cm)        LLQ(cm)
 0
Biophysical Evaluation

 Amniotic F.V:   Within normal limits       F. Tone:         Observed
 F. Movement:    Observed                   Score:           [DATE]
 F. Breathing:   Observed
OB History

 Gravidity:    2         Term:   1
 Living:       1
Gestational Age

 Clinical EDD:  35w 6d                                        EDD:   [DATE]
 Best:          35w 6d     Det. By:  Clinical EDD             EDD:   [DATE]
Anatomy

 Thoracic:              Appears normal         Kidneys:                Appear normal
 Stomach:               Appears normal, left   Bladder:                Appears normal
                        sided
Impression

 Patient had nonreactive NST and BPP was requested.

 Amniotic fluid is normal and good fetal activity is seen
 .Antenatal testing is reassuring. BPP [DATE]. Cephalic
 presentation.
                 AZNAM

## 2021-05-04 NOTE — MAU Provider Note (Signed)
History     CSN: [id number]  Arrival date and time: 05/04/21 1359   Event Date/Time   First Provider Initiated Contact with Patient 05/04/21 1436      Chief Complaint  Patient presents with   fetal monitoring   HPI Aprel Egelhoff is a 30 y.o. G2P1001 at [redacted]w[redacted]d who presents from the office for fetal monitoring.  Denies abdominal pain, LOF, or vaginal bleeding. Reports good fetal movement.   OB History     Gravida  2   Para  1   Term  1   Preterm      AB      Living  1      SAB      IAB      Ectopic      Multiple      Live Births  1           Past Medical History:  Diagnosis Date   Eczema    Headache    PCOS (polycystic ovarian syndrome)    UTI (urinary tract infection)     Past Surgical History:  Procedure Laterality Date   CESAREAN SECTION      Family History  Problem Relation Age of Onset   Heart disease Mother        blockage,had stint   Diabetes Mother    Hypertension Father     Social History   Tobacco Use   Smoking status: Never   Smokeless tobacco: Never  Vaping Use   Vaping Use: Never used  Substance Use Topics   Alcohol use: No   Drug use: Never    Allergies:  Allergies  Allergen Reactions   Latex Anaphylaxis    Medications Prior to Admission  Medication Sig Dispense Refill Last Dose   calcium carbonate (TUMS - DOSED IN MG ELEMENTAL CALCIUM) 500 MG chewable tablet Chew 1 tablet by mouth as needed for indigestion or heartburn.   Past Week   Prenatal Vit-Fe Fumarate-FA (MULTIVITAMIN-PRENATAL) 27-0.8 MG TABS tablet Take 1 tablet by mouth daily at 12 noon.   05/04/2021    Review of Systems  Constitutional: Negative.   Gastrointestinal: Negative.   Genitourinary: Negative.   Physical Exam   Blood pressure 126/65, pulse 88, temperature 98.5 F (36.9 C), temperature source Oral, resp. rate 18, height 5\' 9"  (1.753 m), weight (!) 156 kg, SpO2 99 %.  Physical Exam Vitals and nursing note reviewed.  Constitutional:       General: She is not in acute distress.    Appearance: Normal appearance.  HENT:     Head: Normocephalic and atraumatic.  Eyes:     General: No scleral icterus.    Conjunctiva/sclera: Conjunctivae normal.     Pupils: Pupils are equal, round, and reactive to light.  Pulmonary:     Effort: Pulmonary effort is normal. No respiratory distress.  Neurological:     General: No focal deficit present.     Mental Status: She is alert.  Psychiatric:        Mood and Affect: Mood normal.        Behavior: Behavior normal.   NST:  Baseline: 145 bpm, Variability: Good {> 6 bpm), Accelerations: Reactive, and Decelerations: Absent  MAU Course  Procedures  MDM Dr. called prior to patient's arrival. Reports non reactive tracing in the office & sending patient over for BPP.   Patient has reactive tracing in MAU. No OB complaints. BPP 8/8 with normal AFI.   Dr. Connye Burkitt updated with BPP  results. Patient has follow up scheduled in the office.   Assessment and Plan   1. NST (non-stress test) reactive   2. [redacted] weeks gestation of pregnancy   3. Non-reactive NST (non-stress test)    -reviewed reasons to return to MAU  Judeth Horn 05/04/2021, 2:36 PM

## 2021-05-04 NOTE — MAU Note (Signed)
Stephanie Burton is a 30 y.o. at [redacted]w[redacted]d here in MAU reporting: was sent over from the office because "my daughters heart rate is not fluctuating properly". +FM.   Onset of complaint: today  Pain score: 0/10  Vitals:   05/04/21 1413  BP: 126/65  Pulse: 88  Resp: 18  Temp: 98.5 F (36.9 C)  SpO2: 99%     FHT:156  Lab orders placed from triage: none

## 2021-05-05 ENCOUNTER — Other Ambulatory Visit: Payer: Self-pay | Admitting: Obstetrics and Gynecology

## 2021-05-05 DIAGNOSIS — O99213 Obesity complicating pregnancy, third trimester: Secondary | ICD-10-CM

## 2021-05-14 ENCOUNTER — Ambulatory Visit: Payer: No Typology Code available for payment source | Admitting: *Deleted

## 2021-05-14 ENCOUNTER — Other Ambulatory Visit: Payer: Self-pay

## 2021-05-14 ENCOUNTER — Ambulatory Visit: Payer: No Typology Code available for payment source | Attending: Obstetrics and Gynecology

## 2021-05-14 DIAGNOSIS — O288 Other abnormal findings on antenatal screening of mother: Secondary | ICD-10-CM | POA: Insufficient documentation

## 2021-05-14 DIAGNOSIS — O99213 Obesity complicating pregnancy, third trimester: Secondary | ICD-10-CM

## 2021-05-14 DIAGNOSIS — Z6841 Body Mass Index (BMI) 40.0 and over, adult: Secondary | ICD-10-CM

## 2021-05-14 IMAGING — US US MFM FETAL BPP W/ NON-STRESS
1 series · 13 of 24 positions shown · non-contrast
Comparison: none

[Series 1: us mfm fetal bpp w/ non-stress · 24 acquisitions, 13 frames shown]
[im 1/24]
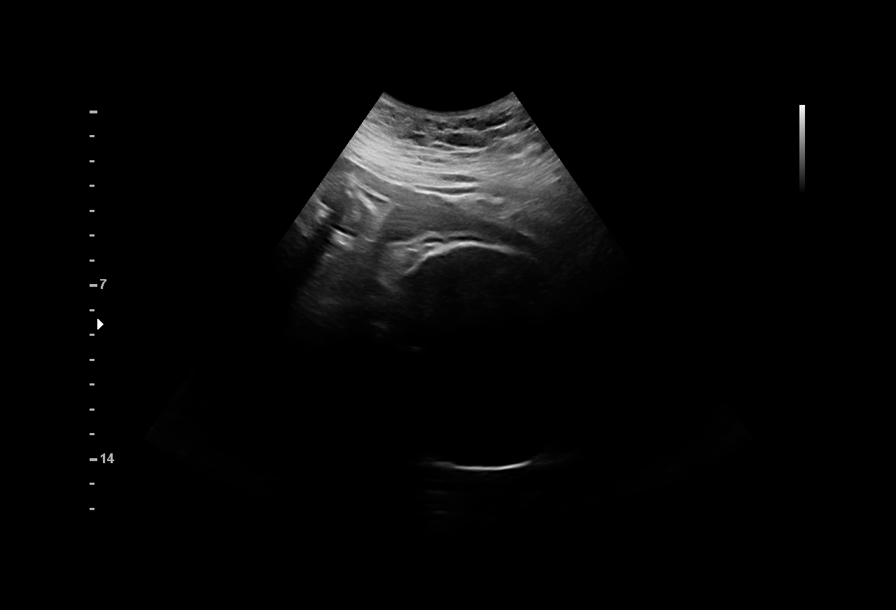
[im 3/24]
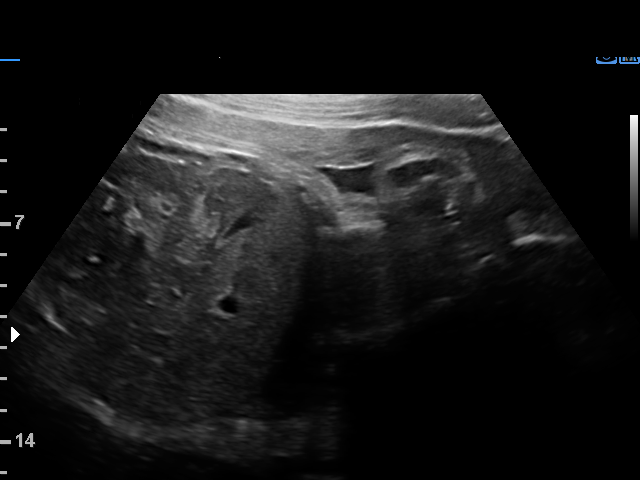
[im 5/24]
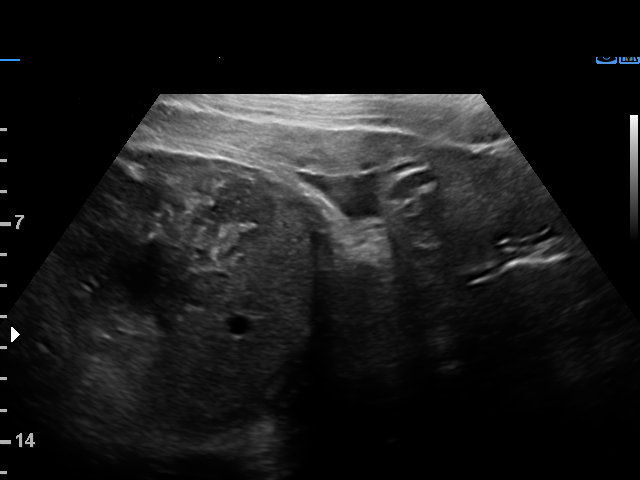
[im 7/24]
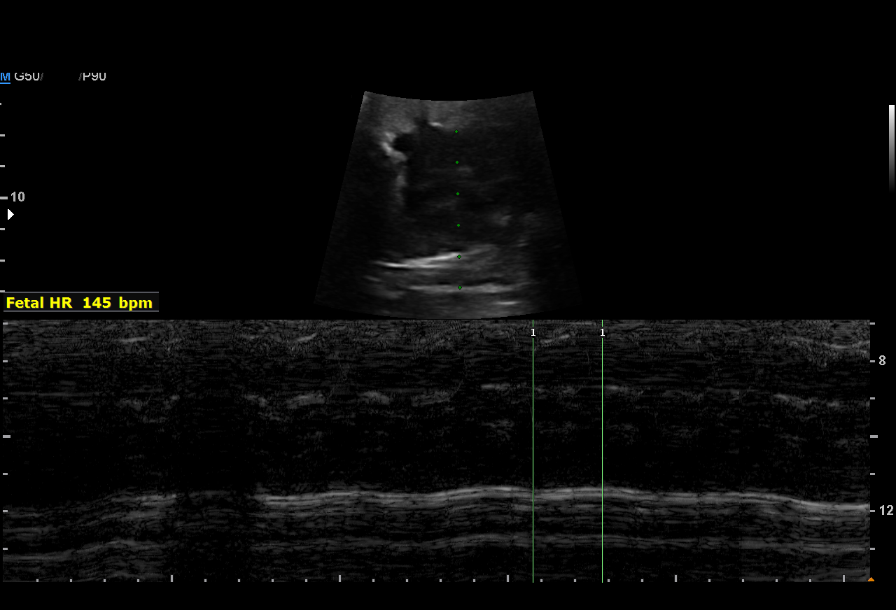
[im 9/24]
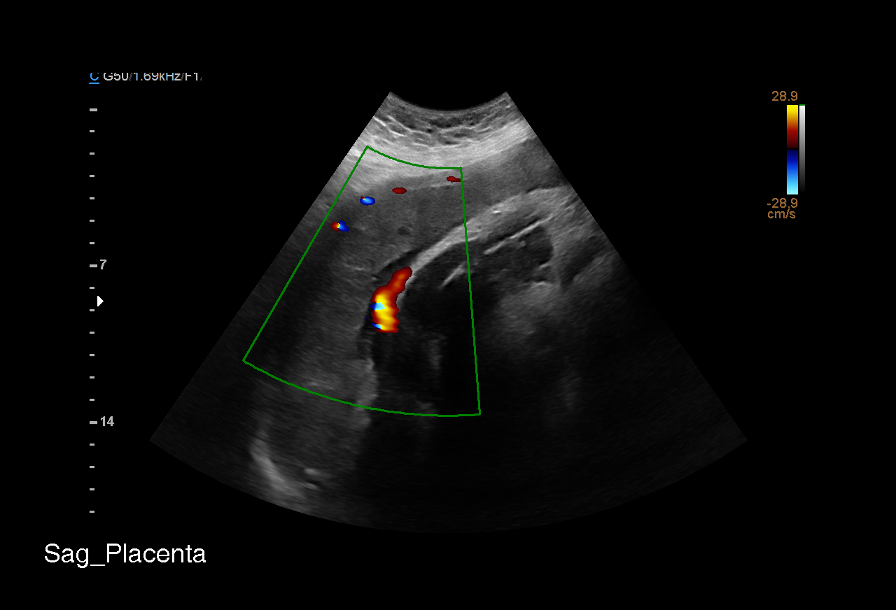
[im 11/24]
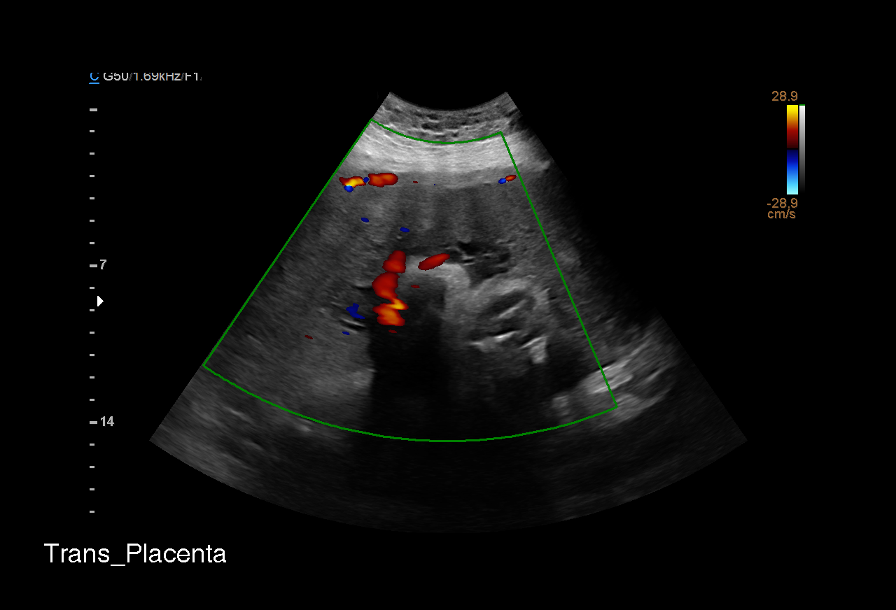
[im 13/24]
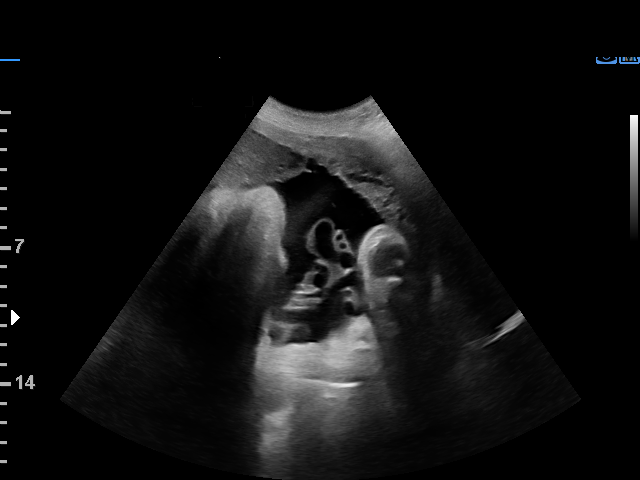
[im 14/24]
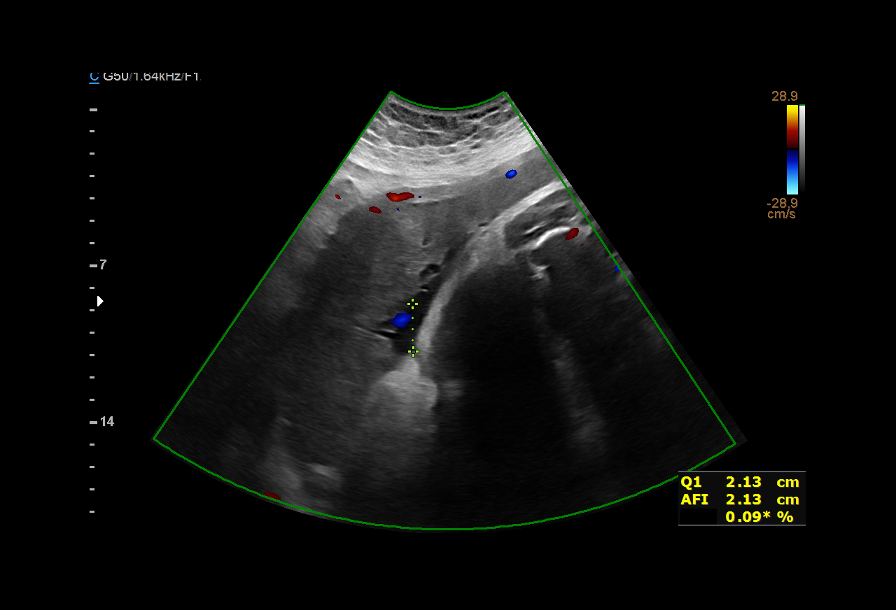
[im 16/24]
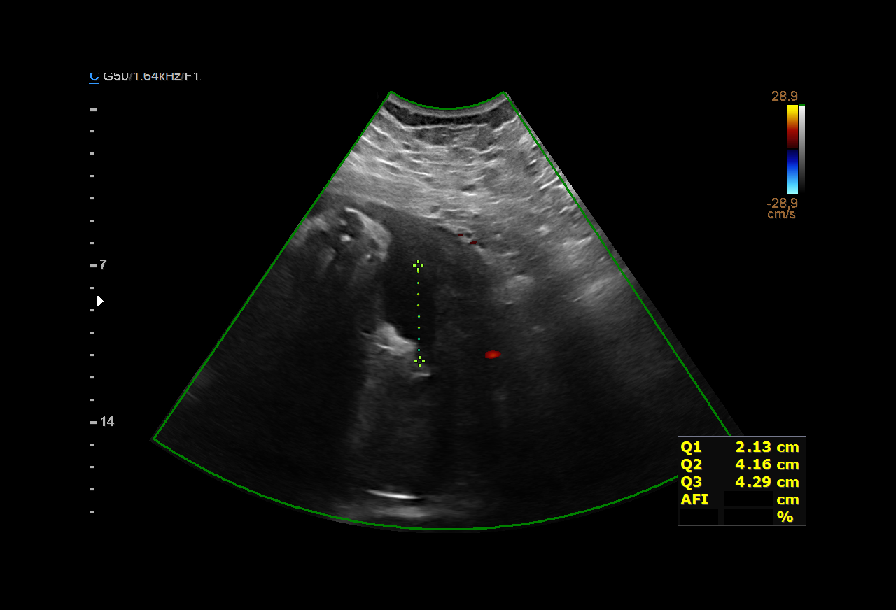
[im 18/24]
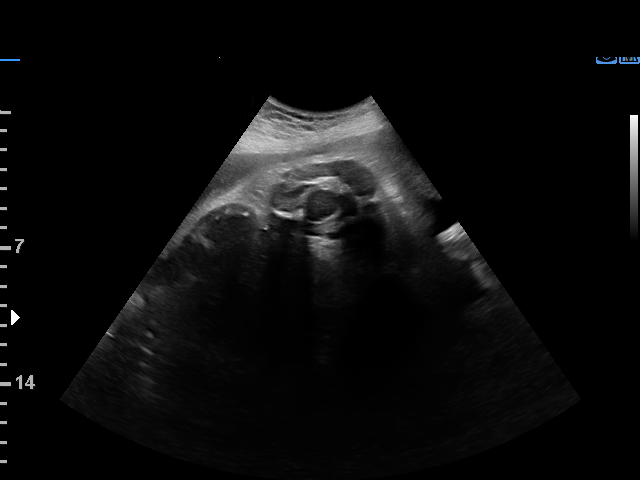
[im 20/24]
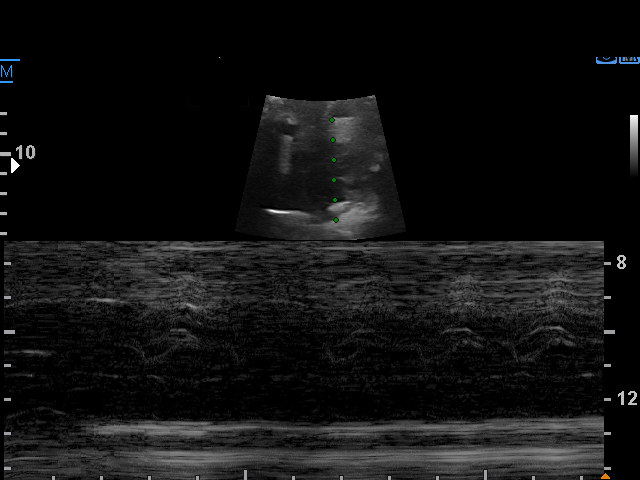
[im 22/24]
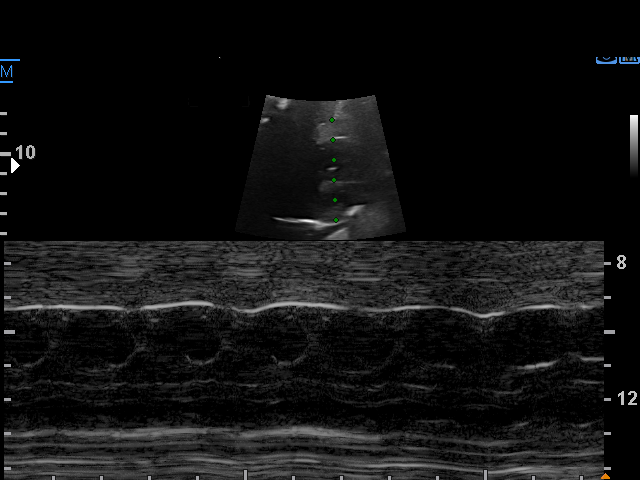
[im 24/24]
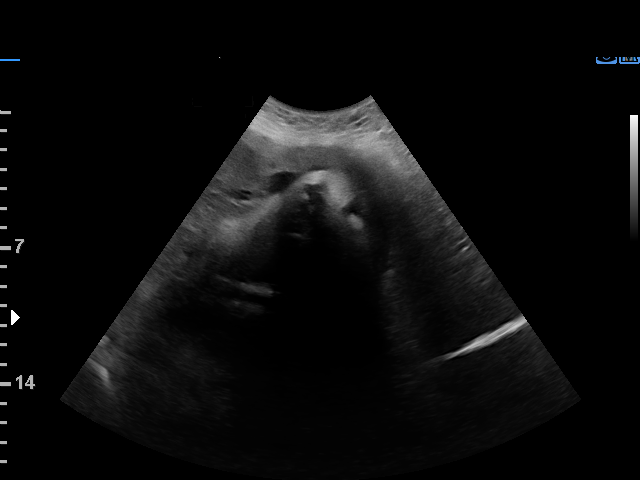

[13 of 24 positions shown; findings below may reference images not displayed]

[REDACTED]
                                                            Ave., [HOSPITAL]

    W/NONSTRESS

Indications

 37 weeks gestation of pregnancy
 Obesity complicating pregnancy, third          [JM]
 trimester (BMI 50)
 Previous cesarean delivery, antepartum         [JM]
Fetal Evaluation

 Num Of Fetuses:         1
 Fetal Heart Rate(bpm):  145
 Cardiac Activity:       Observed
 Presentation:           Cephalic
 Placenta:               Fundal
 P. Cord Insertion:      Previously Visualized

 Amniotic Fluid
 AFI FV:      Within normal limits

 AFI Sum(cm)     %Tile       Largest Pocket(cm)
 12.87           46

 RUQ(cm)       RLQ(cm)       LUQ(cm)        LLQ(cm)

Biophysical Evaluation
 Amniotic F.V:   Pocket => 2 cm             F. Tone:        Observed
 F. Movement:    Observed                   N.S.T:          Reactive
 F. Breathing:   Observed                   Score:          [DATE]
OB History

 Gravidity:    2         Term:   1
 Living:       1
Gestational Age

 Clinical EDD:  37w 2d                                        EDD:   [DATE]
 Best:          37w 2d     Det. By:  Clinical EDD             EDD:   [DATE]
Impression

 Maternal obesity.  Patient is here for antenatal testing.

 Amniotic fluid is normal and good fetal activity seen.
 Cephalic presentation.  Fetal heart rate ranged from 145-164
 on ultrasound.  NST, performed before ultrasound, was
 reactive.  Baseline heart rate was 160 bpm.

 Patient reports she will be undergoing cesarean delivery at
 39 weeks gestation.
Recommendations

 NST at your office next week.
                 HAZAN

## 2021-05-14 NOTE — Progress Notes (Signed)
276lb

## 2021-05-14 NOTE — Procedures (Signed)
Stephanie Burton 06-Aug-1990 104w2d  Fetus A Non-Stress Test Interpretation for 05/14/21  Indication:  Obesity, NR NST previously  Fetal Heart Rate A Mode: External Baseline Rate (A): 160 bpm Variability: Moderate Accelerations: 15 x 15 Decelerations: None Multiple birth?: No  Uterine Activity Mode: Palpation, Toco Contraction Frequency (min): none Resting Tone Palpated: Relaxed  Interpretation (Fetal Testing) Nonstress Test Interpretation: Reactive Overall Impression: Reassuring for gestational age Comments: Dr. Judeth Cornfield reviewed tracing

## 2021-05-18 ENCOUNTER — Encounter (HOSPITAL_COMMUNITY): Payer: Self-pay

## 2021-05-18 NOTE — Patient Instructions (Signed)
Stephanie Burton  05/18/2021   Your procedure is scheduled on:  05/26/2021  Arrive at 1030 at Entrance C on [employer] at [facility]  and [employer]. You are invited to use the FREE valet parking or use the Visitor's parking deck.  Pick up the phone at the desk and dial [phone].  Call this number if you have problems the morning of surgery: [phone]  Remember:   Do not eat food:(After Midnight) Desps de medianoche.  Do not drink clear liquids: (After Midnight) Desps de medianoche.  Take these medicines the morning of surgery with A SIP OF WATER:  none   Do not wear jewelry, make-up or nail polish.  Do not wear lotions, powders, or perfumes. Do not wear deodorant.  Do not shave 48 hours prior to surgery.  Do not bring valuables to the hospital.  [facility] is not   responsible for any belongings or valuables brought to the hospital.  Contacts, dentures or bridgework may not be worn into surgery.  Leave suitcase in the car. After surgery it may be brought to your room.  For patients admitted to the hospital, checkout time is 11:00 AM the day of              discharge.      Please read over the following fact sheets that you were given:     Preparing for Surgery

## 2021-05-19 ENCOUNTER — Encounter (HOSPITAL_COMMUNITY): Payer: Self-pay | Admitting: Obstetrics and Gynecology

## 2021-05-19 ENCOUNTER — Inpatient Hospital Stay (HOSPITAL_COMMUNITY)
Admission: AD | Admit: 2021-05-19 | Discharge: 2021-05-22 | DRG: 785 | Disposition: A | Discharge status: Home / Self Care | Payer: No Typology Code available for payment source | Attending: Obstetrics and Gynecology | Admitting: Obstetrics and Gynecology

## 2021-05-19 ENCOUNTER — Other Ambulatory Visit: Payer: Self-pay

## 2021-05-19 DIAGNOSIS — O288 Other abnormal findings on antenatal screening of mother: Secondary | ICD-10-CM | POA: Diagnosis not present

## 2021-05-19 DIAGNOSIS — Z302 Encounter for sterilization: Secondary | ICD-10-CM

## 2021-05-19 DIAGNOSIS — Z0371 Encounter for suspected problem with amniotic cavity and membrane ruled out: Secondary | ICD-10-CM | POA: Diagnosis not present

## 2021-05-19 DIAGNOSIS — O139 Gestational [pregnancy-induced] hypertension without significant proteinuria, unspecified trimester: Secondary | ICD-10-CM | POA: Diagnosis present

## 2021-05-19 DIAGNOSIS — O163 Unspecified maternal hypertension, third trimester: Secondary | ICD-10-CM | POA: Diagnosis present

## 2021-05-19 DIAGNOSIS — O134 Gestational [pregnancy-induced] hypertension without significant proteinuria, complicating childbirth: Principal | ICD-10-CM | POA: Diagnosis present

## 2021-05-19 DIAGNOSIS — Z3A38 38 weeks gestation of pregnancy: Secondary | ICD-10-CM | POA: Diagnosis not present

## 2021-05-19 DIAGNOSIS — O34219 Maternal care for unspecified type scar from previous cesarean delivery: Secondary | ICD-10-CM | POA: Diagnosis present

## 2021-05-19 DIAGNOSIS — R030 Elevated blood-pressure reading, without diagnosis of hypertension: Secondary | ICD-10-CM

## 2021-05-19 DIAGNOSIS — O99214 Obesity complicating childbirth: Secondary | ICD-10-CM | POA: Diagnosis present

## 2021-05-19 DIAGNOSIS — Z20822 Contact with and (suspected) exposure to covid-19: Secondary | ICD-10-CM | POA: Diagnosis present

## 2021-05-19 LAB — CBC
HCT: 36.9 % (ref 36.0–46.0)
Hemoglobin: 12.2 g/dL (ref 12.0–15.0)
MCH: 26.8 pg (ref 26.0–34.0)
MCHC: 33.1 g/dL (ref 30.0–36.0)
MCV: 81.1 fL (ref 80.0–100.0)
Platelets: 236 10*3/uL (ref 150–400)
RBC: 4.55 MIL/uL (ref 3.87–5.11)
RDW: 13.5 % (ref 11.5–15.5)
WBC: 7.5 10*3/uL (ref 4.0–10.5)
nRBC: 0 % (ref 0.0–0.2)

## 2021-05-19 LAB — POCT FERN TEST: POCT Fern Test: NEGATIVE

## 2021-05-19 NOTE — MAU Note (Signed)
PT SAYS AT 930- SROM- WHILE SITTING -LAUGHING - THEN FELT WETNESS RUN DOWN HER LEG STARTED UC Q 15 MIN DENIES HSV  GBS- NEG  LAST SEX- 1-2 WEEKS AGO [facility] - [facility]- DR DAVIES VE- CLOSED  SCH REPEAT C/S ON 05-26-2021

## 2021-05-19 NOTE — MAU Provider Note (Signed)
History     CSN: [id number]  Arrival date and time: 05/19/21 2217   Event Date/Time   First Provider Initiated Contact with Patient 05/19/21 2316      Chief Complaint  Patient presents with   Rupture of Membranes   HPI Stephanie Burton is a 30 y.o. G2P1001 at [redacted]w[redacted]d who presents for vaginal discharge. This evening while sitting on the cough laughing, she felt a gush of fluid run down her legs. She then went to the bathroom & felt another gush of fluid fall into the toilet. Leaking has not continued. Denies abdominal pain, or vaginal bleeding. Reports good fetal movement.  Denies history of hypertension. Denies headache, visual disturbance, or epigastric pain.   OB History     Gravida  2   Para  1   Term  1   Preterm      AB      Living  1      SAB      IAB      Ectopic      Multiple      Live Births  1           Past Medical History:  Diagnosis Date   Eczema    Headache    PCOS (polycystic ovarian syndrome)    UTI (urinary tract infection)     Past Surgical History:  Procedure Laterality Date   CESAREAN SECTION      Family History  Problem Relation Age of Onset   Heart disease Mother        blockage,had stint   Diabetes Mother    Hypertension Father     Social History   Tobacco Use   Smoking status: Never   Smokeless tobacco: Never  Vaping Use   Vaping Use: Never used  Substance Use Topics   Alcohol use: No   Drug use: Never    Allergies:  Allergies  Allergen Reactions   Latex Anaphylaxis    Medications Prior to Admission  Medication Sig Dispense Refill Last Dose   Prenatal MV & Min w/FA-DHA (PRENATAL GUMMIES PO) Take 2 tablets by mouth in the morning.   05/19/2021   calcium carbonate (TUMS - DOSED IN MG ELEMENTAL CALCIUM) 500 MG chewable tablet Chew 2 tablets by mouth 3 (three) times daily as needed for indigestion or heartburn.       Review of Systems  Constitutional: Negative.   Eyes:  Negative for visual disturbance.   Gastrointestinal: Negative.   Genitourinary:  Positive for vaginal discharge.  Neurological:  Negative for headaches.  Physical Exam   Blood pressure 133/70, pulse 98, temperature 97.9 F (36.6 C), temperature source Oral, resp. rate 20, height 5\' 9"  (1.753 m), weight (!) 158.6 kg, SpO2 98 %.  Patient Vitals for the past 24 hrs:  BP Temp Temp src Pulse Resp SpO2 Height Weight  05/20/21 0030 133/70 -- -- 98 -- 98 % -- --  05/20/21 0015 128/70 -- -- (!) 104 -- 99 % -- --  05/20/21 0001 114/86 -- -- (!) 102 -- -- -- --  05/19/21 2346 133/61 -- -- (!) 104 -- -- -- --  05/19/21 2331 130/69 -- -- 91 -- -- -- --  05/19/21 2316 127/65 -- -- (!) 102 -- -- -- --  05/19/21 2305 (!) 147/71 -- -- (!) 102 -- 98 % -- --  05/19/21 2241 (!) 150/81 97.9 F (36.6 C) Oral 97 20 -- 5\' 9"  (1.753 m) (!) 158.6 kg    Physical  Exam Vitals and nursing note reviewed. Exam conducted with a chaperone present.  Constitutional:      General: She is not in acute distress.    Appearance: Normal appearance.  HENT:     Head: Normocephalic and atraumatic.  Eyes:     General: No scleral icterus.    Conjunctiva/sclera: Conjunctivae normal.  Pulmonary:     Effort: Pulmonary effort is normal. No respiratory distress.  Genitourinary:    Comments: Pelvic: NEFG, thin white discharge. No pooling of fluid. No blood Skin:    General: Skin is warm and dry.  Neurological:     General: No focal deficit present.     Mental Status: She is alert.  Psychiatric:        Mood and Affect: Mood normal.        Behavior: Behavior normal.   NST:  Baseline: 155 bpm, Variability: Good {> 6 bpm), Accelerations: 10x10, and Decelerations: Variable: mild  MAU Course  Procedures Results for orders placed or performed during the hospital encounter of 05/19/21 (from the past 24 hour(s))  Protein / creatinine ratio, urine     Status: None   Collection Time: 05/19/21 11:26 PM  Result Value Ref Range   Creatinine, Urine 159.92 mg/dL    Total Protein, Urine 11 mg/dL   Protein Creatinine Ratio 0.07 0.00 - 0.15 mg/mg[Cre]  CBC     Status: None   Collection Time: 05/19/21 11:33 PM  Result Value Ref Range   WBC 7.5 4.0 - 10.5 K/uL   RBC 4.55 3.87 - 5.11 MIL/uL   Hemoglobin 12.2 12.0 - 15.0 g/dL   HCT 36.9 36.0 - 46.0 %   MCV 81.1 80.0 - 100.0 fL   MCH 26.8 26.0 - 34.0 pg   MCHC 33.1 30.0 - 36.0 g/dL   RDW 13.5 11.5 - 15.5 %   Platelets 236 150 - 400 K/uL   nRBC 0.0 0.0 - 0.2 %  Comprehensive metabolic panel     Status: Abnormal   Collection Time: 05/19/21 11:33 PM  Result Value Ref Range   Sodium 136 135 - 145 mmol/L   Potassium 4.1 3.5 - 5.1 mmol/L   Chloride 108 98 - 111 mmol/L   CO2 21 (L) 22 - 32 mmol/L   Glucose, Bld 123 (H) 70 - 99 mg/dL   BUN 8 6 - 20 mg/dL   Creatinine, Ser 0.83 0.44 - 1.00 mg/dL   Calcium 9.0 8.9 - 10.3 mg/dL   Total Protein 6.2 (L) 6.5 - 8.1 g/dL   Albumin 2.6 (L) 3.5 - 5.0 g/dL   AST 15 15 - 41 U/L   ALT 13 0 - 44 U/L   Alkaline Phosphatase 114 38 - 126 U/L   Total Bilirubin 0.4 0.3 - 1.2 mg/dL   GFR, Estimated NOT CALCULATED >60 mL/min   Anion gap 7 5 - 15  POCT fern test     Status: Normal   Collection Time: 05/19/21 11:40 PM  Result Value Ref Range   POCT Fern Test Negative = intact amniotic membranes   Amnisure rupture of membrane (rom)not at [facility]     Status: None   Collection Time: 05/20/21 12:09 AM  Result Value Ref Range   Amnisure ROM NEGATIVE     MDM R/o SROM: sterile speculum exam performed, no pooling of fluid. Fern & amnisure negative. Denies abdominal pain & irregular contractions on monitor; cervical exam deferred.   Elevated BP: first 2 BPs elevated in MAU. Denies HTN history. BPs normalized without  intervention. She is asymptomatic & has normal preeclampsia labs.   Fetal status: Category 2 tracing. BPP 8/8 with normal AFI.   Reviewed patient with Dr. Normand Sloop. Will admit for 23 hour obs  Assessment and Plan   1. Elevated BP without diagnosis of  hypertension   2. Non-reactive NST (non-stress test)   3. [redacted] weeks gestation of pregnancy   4. Encounter for suspected PROM, with rupture of membranes not found    -Admit for observation per Dr. Meyer Russel 05/20/2021, 1:56 AM

## 2021-05-20 ENCOUNTER — Encounter (HOSPITAL_COMMUNITY)
Admission: AD | Disposition: A | Discharge status: Home / Self Care | Payer: Self-pay | Source: Home / Self Care | Attending: Obstetrics and Gynecology

## 2021-05-20 ENCOUNTER — Inpatient Hospital Stay (HOSPITAL_BASED_OUTPATIENT_CLINIC_OR_DEPARTMENT_OTHER): Payer: No Typology Code available for payment source

## 2021-05-20 ENCOUNTER — Observation Stay (HOSPITAL_COMMUNITY): Payer: No Typology Code available for payment source | Admitting: Anesthesiology

## 2021-05-20 ENCOUNTER — Encounter (HOSPITAL_COMMUNITY): Payer: Self-pay | Admitting: Obstetrics and Gynecology

## 2021-05-20 DIAGNOSIS — O288 Other abnormal findings on antenatal screening of mother: Secondary | ICD-10-CM

## 2021-05-20 DIAGNOSIS — Z3A38 38 weeks gestation of pregnancy: Secondary | ICD-10-CM | POA: Diagnosis not present

## 2021-05-20 DIAGNOSIS — O163 Unspecified maternal hypertension, third trimester: Secondary | ICD-10-CM | POA: Diagnosis present

## 2021-05-20 DIAGNOSIS — O139 Gestational [pregnancy-induced] hypertension without significant proteinuria, unspecified trimester: Secondary | ICD-10-CM | POA: Diagnosis present

## 2021-05-20 DIAGNOSIS — R030 Elevated blood-pressure reading, without diagnosis of hypertension: Secondary | ICD-10-CM | POA: Diagnosis present

## 2021-05-20 DIAGNOSIS — Z0371 Encounter for suspected problem with amniotic cavity and membrane ruled out: Secondary | ICD-10-CM

## 2021-05-20 DIAGNOSIS — Z20822 Contact with and (suspected) exposure to covid-19: Secondary | ICD-10-CM | POA: Diagnosis present

## 2021-05-20 DIAGNOSIS — O99214 Obesity complicating childbirth: Secondary | ICD-10-CM | POA: Diagnosis present

## 2021-05-20 DIAGNOSIS — O34219 Maternal care for unspecified type scar from previous cesarean delivery: Secondary | ICD-10-CM | POA: Diagnosis present

## 2021-05-20 DIAGNOSIS — Z302 Encounter for sterilization: Secondary | ICD-10-CM | POA: Diagnosis not present

## 2021-05-20 DIAGNOSIS — O134 Gestational [pregnancy-induced] hypertension without significant proteinuria, complicating childbirth: Secondary | ICD-10-CM | POA: Diagnosis present

## 2021-05-20 LAB — COMPREHENSIVE METABOLIC PANEL
ALT: 12 U/L (ref 0–44)
ALT: 13 U/L (ref 0–44)
AST: 13 U/L — ABNORMAL LOW (ref 15–41)
AST: 15 U/L (ref 15–41)
Albumin: 2.5 g/dL — ABNORMAL LOW (ref 3.5–5.0)
Albumin: 2.6 g/dL — ABNORMAL LOW (ref 3.5–5.0)
Alkaline Phosphatase: 112 U/L (ref 38–126)
Alkaline Phosphatase: 114 U/L (ref 38–126)
Anion gap: 10 (ref 5–15)
Anion gap: 7 (ref 5–15)
BUN: 8 mg/dL (ref 6–20)
BUN: 9 mg/dL (ref 6–20)
CO2: 21 mmol/L — ABNORMAL LOW (ref 22–32)
CO2: 22 mmol/L (ref 22–32)
Calcium: 9 mg/dL (ref 8.9–10.3)
Calcium: 9 mg/dL (ref 8.9–10.3)
Chloride: 105 mmol/L (ref 98–111)
Chloride: 108 mmol/L (ref 98–111)
Creatinine, Ser: 0.83 mg/dL (ref 0.44–1.00)
Creatinine, Ser: 0.87 mg/dL (ref 0.44–1.00)
GFR, Estimated: >60 mL/min (ref >60)
Glucose, Bld: 104 mg/dL — ABNORMAL HIGH (ref 70–99)
Glucose, Bld: 123 mg/dL — ABNORMAL HIGH (ref 70–99)
Potassium: 4 mmol/L (ref 3.5–5.1)
Potassium: 4.1 mmol/L (ref 3.5–5.1)
Sodium: 136 mmol/L (ref 135–145)
Sodium: 137 mmol/L (ref 135–145)
Total Bilirubin: 0.4 mg/dL (ref 0.3–1.2)
Total Bilirubin: 0.4 mg/dL (ref 0.3–1.2)
Total Protein: 5.9 g/dL — ABNORMAL LOW (ref 6.5–8.1)
Total Protein: 6.2 g/dL — ABNORMAL LOW (ref 6.5–8.1)

## 2021-05-20 LAB — CBC
HCT: 35.4 % — ABNORMAL LOW (ref 36.0–46.0)
Hemoglobin: 11.5 g/dL — ABNORMAL LOW (ref 12.0–15.0)
MCH: 26.9 pg (ref 26.0–34.0)
MCHC: 32.5 g/dL (ref 30.0–36.0)
MCV: 82.7 fL (ref 80.0–100.0)
Platelets: 223 10*3/uL (ref 150–400)
RBC: 4.28 MIL/uL (ref 3.87–5.11)
RDW: 13.6 % (ref 11.5–15.5)
WBC: 8.3 10*3/uL (ref 4.0–10.5)
nRBC: 0 % (ref 0.0–0.2)

## 2021-05-20 LAB — RESP PANEL BY RT-PCR (FLU A&B, COVID) ARPGX2
Influenza A by PCR: NEGATIVE
Influenza B by PCR: NEGATIVE
SARS Coronavirus 2 by RT PCR: NEGATIVE

## 2021-05-20 LAB — PROTEIN / CREATININE RATIO, URINE
Creatinine, Urine: 159.92 mg/dL
Protein Creatinine Ratio: 0.07 mg/mg{Cre} (ref 0.00–0.15)
Total Protein, Urine: 11 mg/dL

## 2021-05-20 LAB — TYPE AND SCREEN
ABO/RH(D): A POS
Antibody Screen: NEGATIVE

## 2021-05-20 LAB — AMNISURE RUPTURE OF MEMBRANE (ROM) NOT AT ARMC: Amnisure ROM: NEGATIVE

## 2021-05-20 IMAGING — US US MFM FETAL BPP W/O NON-STRESS
1 series · 12 of 28 positions shown · non-contrast
Comparison: none

[Series 1: us mfm fetal bpp w/o non-stress · 29 acquisitions, 12 frames shown]
[im 2/29]
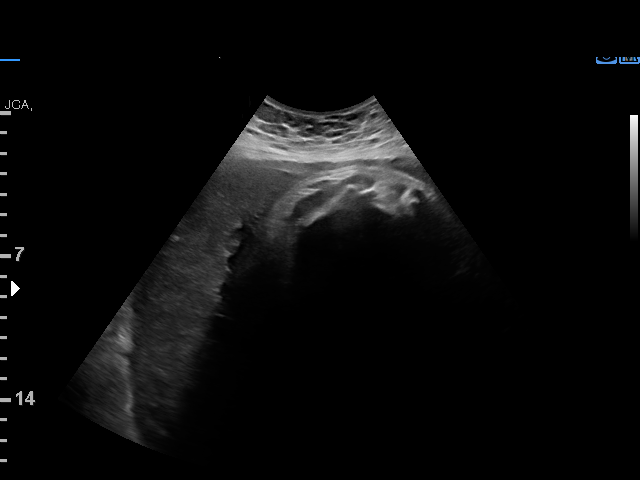
[im 4/29]
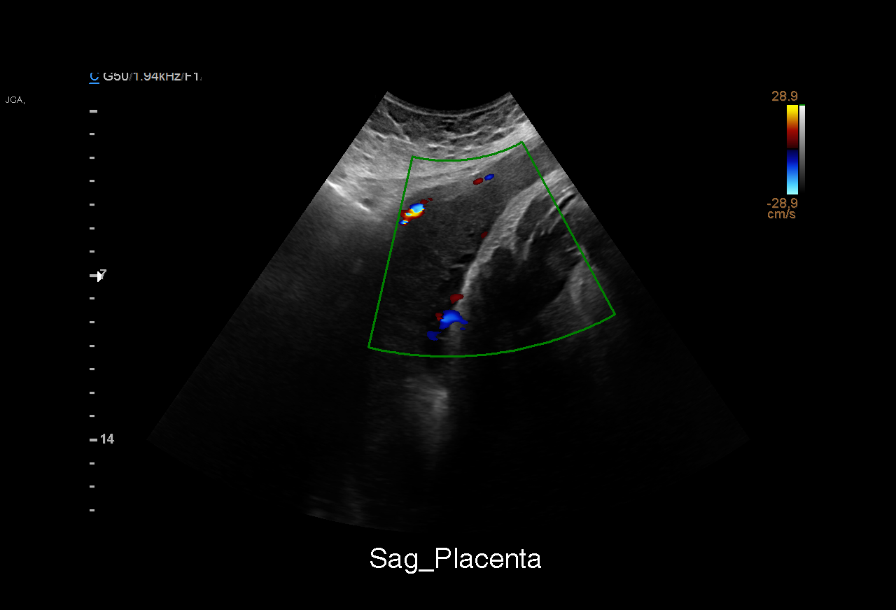
[im 6/29]
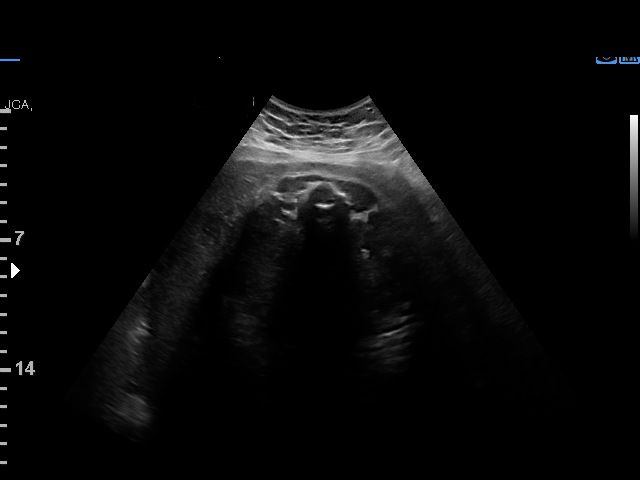
[im 9/29]
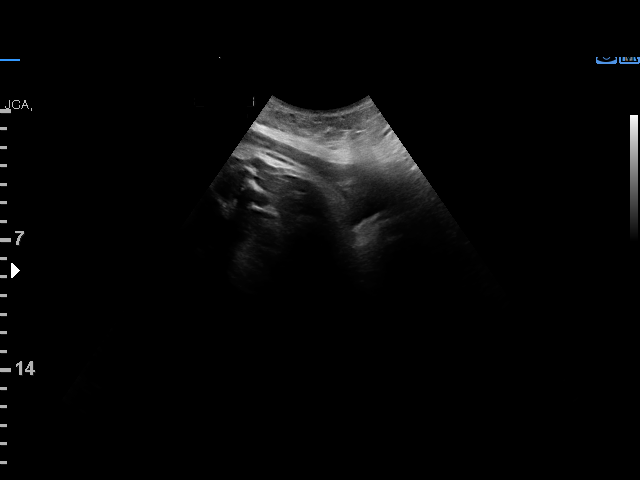
[im 11/29]
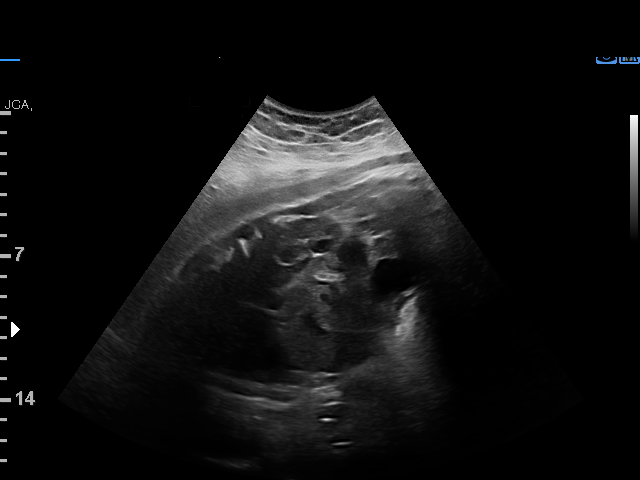
[im 13/29]
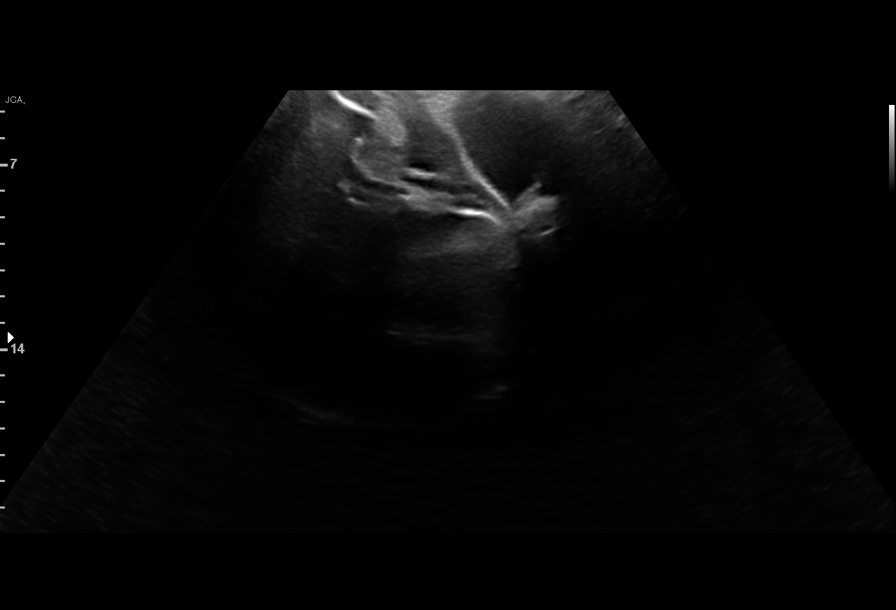
[im 16/29]
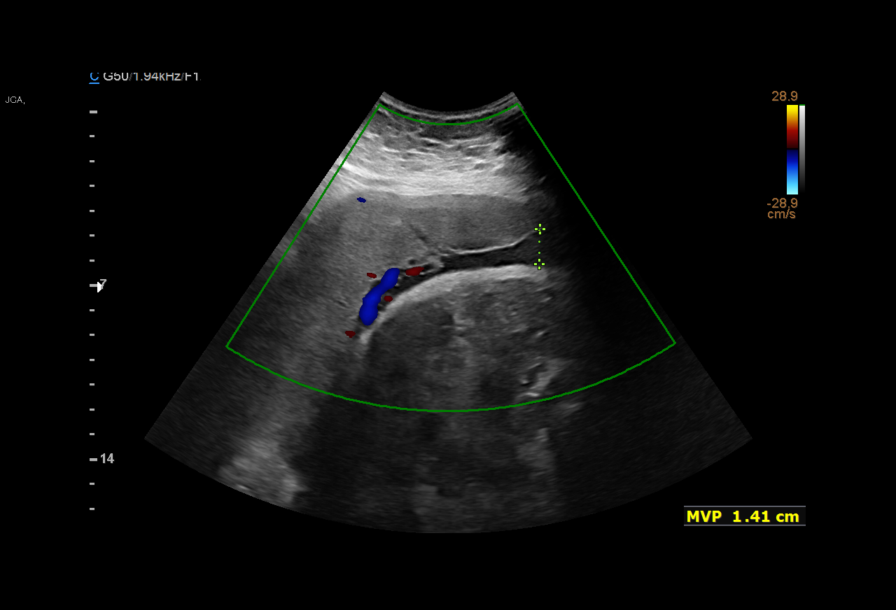
[im 18/29]
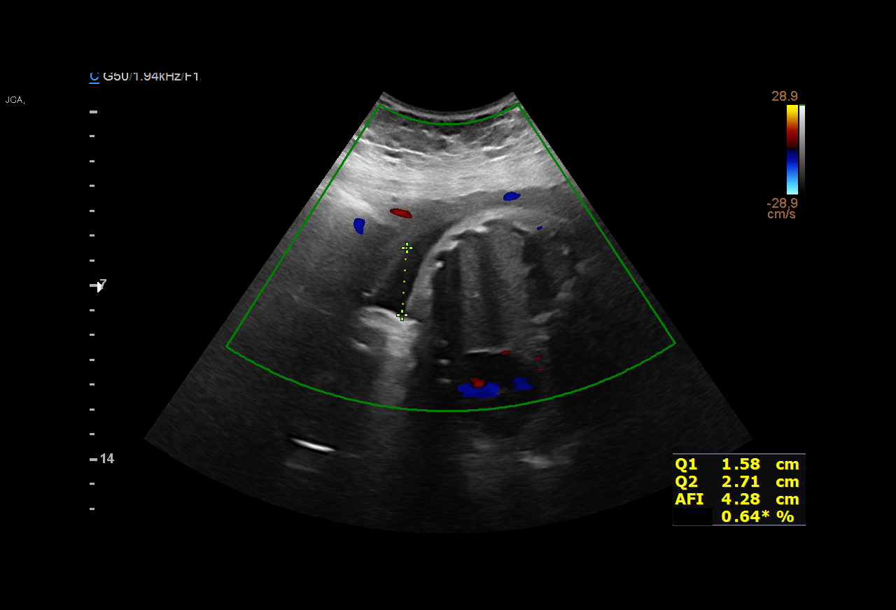
[im 20/29]
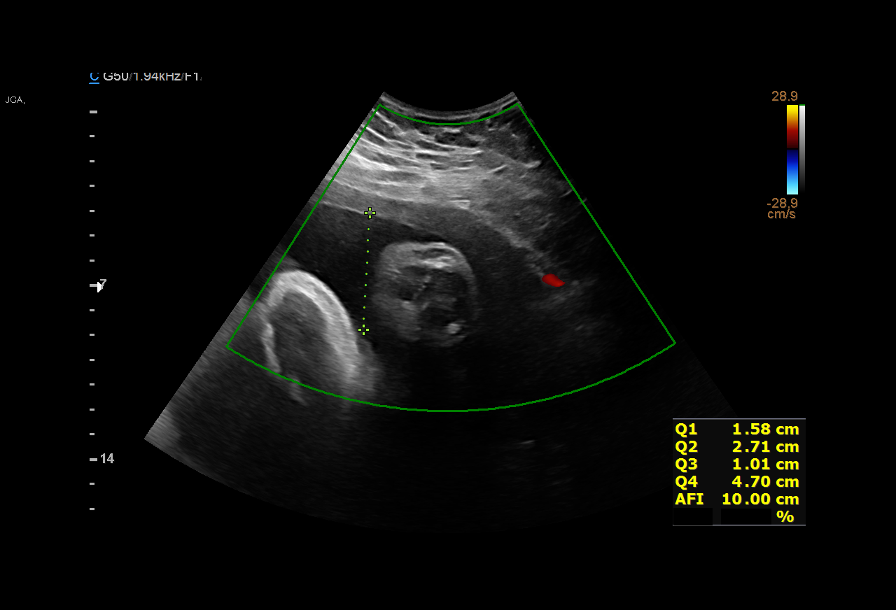
[im 23/29]
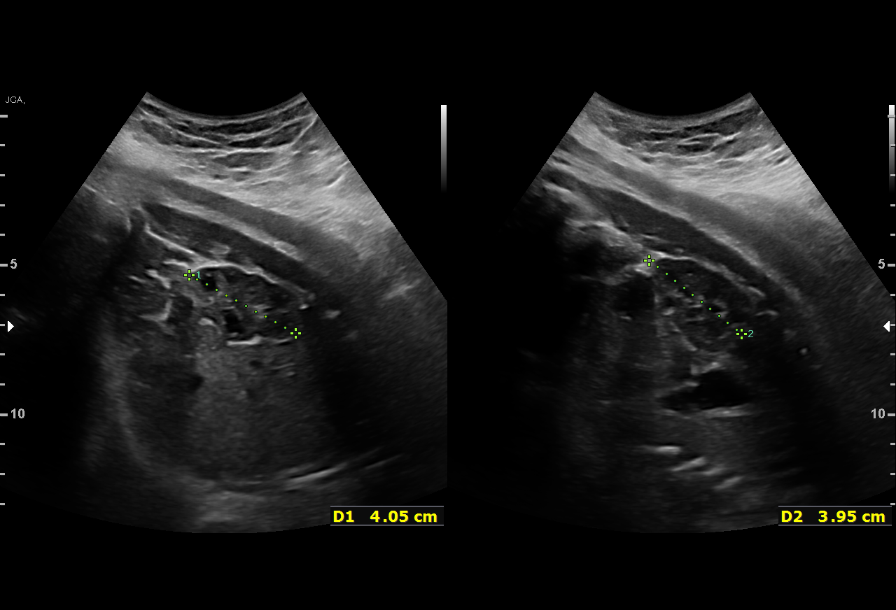
[im 25/29]
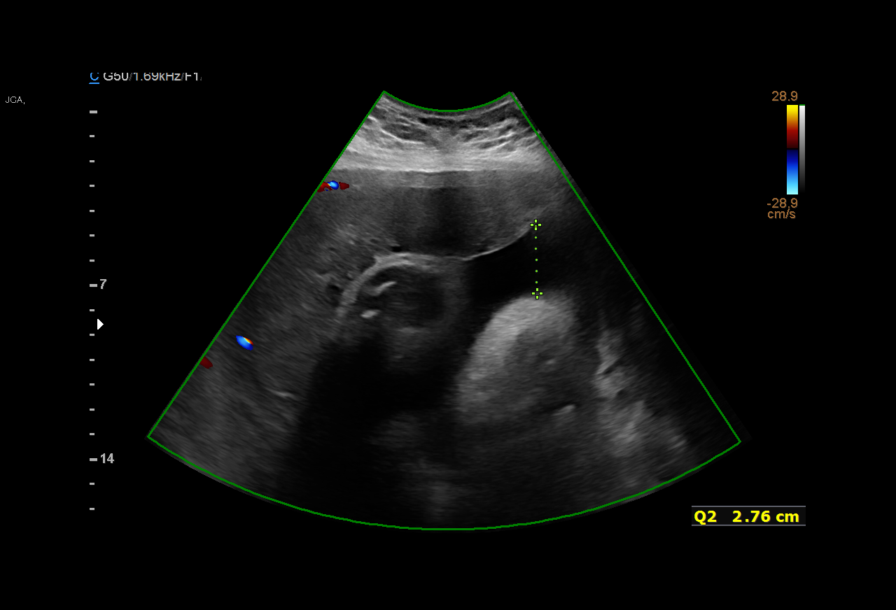
[im 27/29]
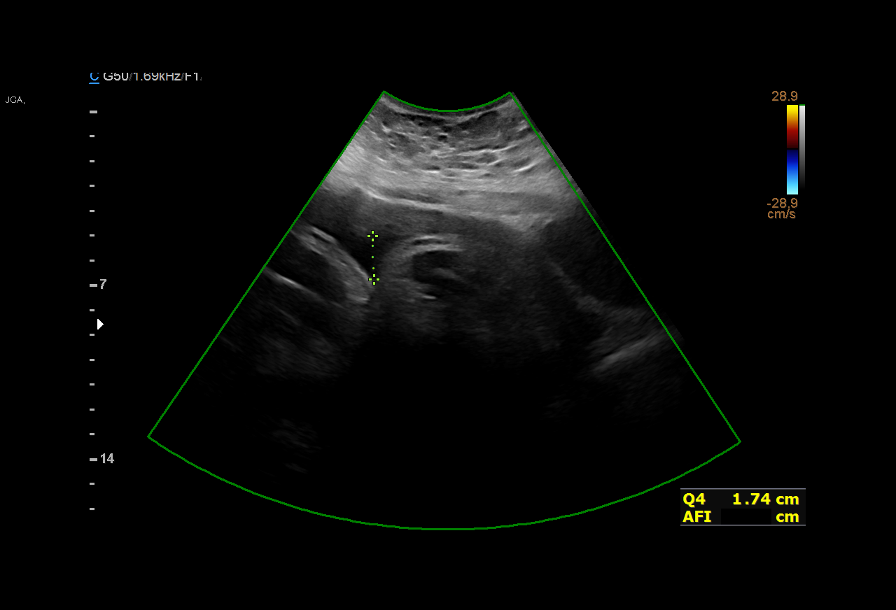

[12 of 28 positions shown; findings below may reference images not displayed]

Referred By:      [facility] MAU/Triage         [facility]

Indications

 Non-reactive NST                               [NI]
 38 weeks gestation of pregnancy
Fetal Evaluation

 Num Of Fetuses:         1
 Fetal Heart Rate(bpm):  176
 Cardiac Activity:       Observed
 Presentation:           Cephalic
 Placenta:               Fundal
 P. Cord Insertion:      Previously Visualized

 Amniotic Fluid
 AFI FV:      Within normal limits

 AFI Sum(cm)     %Tile       Largest Pocket(cm)
 10.9            33

 RUQ(cm)       RLQ(cm)       LUQ(cm)        LLQ(cm)

Biophysical Evaluation

 Amniotic F.V:   Within normal limits       F. Tone:        Observed
 F. Movement:    Observed                   Score:          [DATE]
 F. Breathing:   Observed
OB History

 Gravidity:    2         Term:   1
 Living:       1
Gestational Age

 Clinical EDD:  38w 1d                                        EDD:   [DATE]
 Best:          38w 1d     Det. By:  Clinical EDD             EDD:   [DATE]
Comments

 This patient had a BPP performed today due to a nonreactive
 NST in the PAD[facility].
 A biophysical profile performed today was [DATE].
 There was normal amniotic fluid noted on today's ultrasound
 exam.

## 2021-05-20 SURGERY — Surgical Case
Anesthesia: Spinal

## 2021-05-20 MED ORDER — HYDROMORPHONE HCL 1 MG/ML IJ SOLN: 0.2500 mg | INTRAMUSCULAR | Status: DC | PRN

## 2021-05-20 MED ORDER — OXYTOCIN-SODIUM CHLORIDE 30-0.9 UT/500ML-% IV SOLN
INTRAVENOUS | Status: DC | PRN
  Administered 2021-05-20: 300 mL via INTRAVENOUS

## 2021-05-20 MED ORDER — ONDANSETRON HCL 4 MG/2ML IJ SOLN
INTRAMUSCULAR | Status: AC
  Filled 2021-05-20: qty 2

## 2021-05-20 MED ORDER — DIPHENHYDRAMINE HCL 25 MG PO CAPS: 25.0000 mg | ORAL_CAPSULE | Freq: Four times a day (QID) | ORAL | Status: DC | PRN

## 2021-05-20 MED ORDER — LABETALOL HCL 5 MG/ML IV SOLN: 80.0000 mg | INTRAVENOUS | Status: DC | PRN

## 2021-05-20 MED ORDER — DIBUCAINE (PERIANAL) 1 % EX OINT: 1.0000 "application " | TOPICAL_OINTMENT | CUTANEOUS | Status: DC | PRN

## 2021-05-20 MED ORDER — MEPERIDINE HCL 25 MG/ML IJ SOLN: 6.2500 mg | INTRAMUSCULAR | Status: DC | PRN

## 2021-05-20 MED ORDER — ACETAMINOPHEN 325 MG PO TABS
650.0000 mg | ORAL_TABLET | ORAL | Status: DC | PRN
  Administered 2021-05-20: 10:00:00 650 mg via ORAL
  Filled 2021-05-20: qty 2

## 2021-05-20 MED ORDER — SODIUM CHLORIDE 0.9% FLUSH: 3.0000 mL | INTRAVENOUS | Status: DC | PRN

## 2021-05-20 MED ORDER — COCONUT OIL OIL: 1.0000 "application " | TOPICAL_OIL | Status: DC | PRN

## 2021-05-20 MED ORDER — SIMETHICONE 80 MG PO CHEW
80.0000 mg | CHEWABLE_TABLET | Freq: Three times a day (TID) | ORAL | Status: DC
  Administered 2021-05-21 – 2021-05-22 (×4): 80 mg via ORAL
  Filled 2021-05-20 (×4): qty 1

## 2021-05-20 MED ORDER — ZOLPIDEM TARTRATE 5 MG PO TABS: 5.0000 mg | ORAL_TABLET | Freq: Every evening | ORAL | Status: DC | PRN

## 2021-05-20 MED ORDER — MORPHINE SULFATE (PF) 0.5 MG/ML IJ SOLN
INTRAMUSCULAR | Status: AC
  Filled 2021-05-20: qty 10

## 2021-05-20 MED ORDER — CALCIUM CARBONATE ANTACID 500 MG PO CHEW: 2.0000 | CHEWABLE_TABLET | ORAL | Status: DC | PRN

## 2021-05-20 MED ORDER — CEFAZOLIN IN SODIUM CHLORIDE 3-0.9 GM/100ML-% IV SOLN
3.0000 g | INTRAVENOUS | Status: AC
  Administered 2021-05-20: 17:00:00 3 g via INTRAVENOUS

## 2021-05-20 MED ORDER — KETOROLAC TROMETHAMINE 30 MG/ML IJ SOLN
INTRAMUSCULAR | Status: AC
  Filled 2021-05-20: qty 1

## 2021-05-20 MED ORDER — STERILE WATER FOR IRRIGATION IR SOLN
Status: DC | PRN
  Administered 2021-05-20: 1000 mL

## 2021-05-20 MED ORDER — HYDRALAZINE HCL 20 MG/ML IJ SOLN: 10.0000 mg | INTRAMUSCULAR | Status: DC | PRN

## 2021-05-20 MED ORDER — ONDANSETRON HCL 4 MG/2ML IJ SOLN: 4.0000 mg | Freq: Three times a day (TID) | INTRAMUSCULAR | Status: DC | PRN

## 2021-05-20 MED ORDER — OXYCODONE HCL 5 MG PO TABS: 5.0000 mg | ORAL_TABLET | Freq: Once | ORAL | Status: DC | PRN

## 2021-05-20 MED ORDER — MENTHOL 3 MG MT LOZG: 1.0000 | LOZENGE | OROMUCOSAL | Status: DC | PRN

## 2021-05-20 MED ORDER — IBUPROFEN 600 MG PO TABS
600.0000 mg | ORAL_TABLET | Freq: Four times a day (QID) | ORAL | Status: DC
  Administered 2021-05-21 – 2021-05-22 (×4): 600 mg via ORAL
  Filled 2021-05-20 (×4): qty 1

## 2021-05-20 MED ORDER — DIPHENHYDRAMINE HCL 50 MG/ML IJ SOLN
12.5000 mg | INTRAMUSCULAR | Status: DC | PRN
  Administered 2021-05-20: 21:00:00 12.5 mg via INTRAVENOUS
  Filled 2021-05-20: qty 1

## 2021-05-20 MED ORDER — ENOXAPARIN SODIUM 80 MG/0.8ML IJ SOSY
80.0000 mg | PREFILLED_SYRINGE | INTRAMUSCULAR | Status: DC
  Administered 2021-05-21 – 2021-05-22 (×2): 80 mg via SUBCUTANEOUS
  Filled 2021-05-20 (×2): qty 0.8

## 2021-05-20 MED ORDER — DOCUSATE SODIUM 100 MG PO CAPS: 100.0000 mg | ORAL_CAPSULE | Freq: Every day | ORAL | Status: DC

## 2021-05-20 MED ORDER — MORPHINE SULFATE (PF) 2 MG/ML IV SOLN: 1.0000 mg | INTRAVENOUS | Status: DC | PRN

## 2021-05-20 MED ORDER — LABETALOL HCL 5 MG/ML IV SOLN: 40.0000 mg | INTRAVENOUS | Status: DC | PRN

## 2021-05-20 MED ORDER — NALOXONE HCL 0.4 MG/ML IJ SOLN: 0.4000 mg | INTRAMUSCULAR | Status: DC | PRN

## 2021-05-20 MED ORDER — SOD CITRATE-CITRIC ACID 500-334 MG/5ML PO SOLN
ORAL | Status: AC
  Administered 2021-05-20: 16:00:00 30 mL
  Filled 2021-05-20: qty 30

## 2021-05-20 MED ORDER — ONDANSETRON HCL 4 MG/2ML IJ SOLN: 4.0000 mg | Freq: Once | INTRAMUSCULAR | Status: DC | PRN

## 2021-05-20 MED ORDER — KETOROLAC TROMETHAMINE 30 MG/ML IJ SOLN
30.0000 mg | Freq: Four times a day (QID) | INTRAMUSCULAR | Status: AC
  Administered 2021-05-21 (×3): 30 mg via INTRAVENOUS
  Filled 2021-05-20 (×3): qty 1

## 2021-05-20 MED ORDER — SENNOSIDES-DOCUSATE SODIUM 8.6-50 MG PO TABS
2.0000 | ORAL_TABLET | Freq: Every day | ORAL | Status: DC
  Administered 2021-05-21 – 2021-05-22 (×2): 2 via ORAL
  Filled 2021-05-20 (×2): qty 2

## 2021-05-20 MED ORDER — OXYTOCIN-SODIUM CHLORIDE 30-0.9 UT/500ML-% IV SOLN
2.5000 [IU]/h | INTRAVENOUS | Status: AC
  Administered 2021-05-20: 20:00:00 2.5 [IU]/h via INTRAVENOUS

## 2021-05-20 MED ORDER — ONDANSETRON HCL 4 MG/2ML IJ SOLN
INTRAMUSCULAR | Status: DC | PRN
  Administered 2021-05-20: 4 mg via INTRAVENOUS

## 2021-05-20 MED ORDER — LABETALOL HCL 5 MG/ML IV SOLN: 20.0000 mg | INTRAVENOUS | Status: DC | PRN

## 2021-05-20 MED ORDER — SODIUM CHLORIDE 0.9 % IR SOLN
Status: DC | PRN
  Administered 2021-05-20: 1

## 2021-05-20 MED ORDER — ACETAMINOPHEN 500 MG PO TABS
1000.0000 mg | ORAL_TABLET | Freq: Four times a day (QID) | ORAL | Status: DC
  Administered 2021-05-21 – 2021-05-22 (×7): 1000 mg via ORAL
  Filled 2021-05-20 (×7): qty 2

## 2021-05-20 MED ORDER — OXYCODONE HCL 5 MG/5ML PO SOLN: 5.0000 mg | Freq: Once | ORAL | Status: DC | PRN

## 2021-05-20 MED ORDER — DIPHENHYDRAMINE HCL 25 MG PO CAPS: 25.0000 mg | ORAL_CAPSULE | ORAL | Status: DC | PRN

## 2021-05-20 MED ORDER — MORPHINE SULFATE (PF) 0.5 MG/ML IJ SOLN
INTRAMUSCULAR | Status: DC | PRN
  Administered 2021-05-20: 150 ug via EPIDURAL

## 2021-05-20 MED ORDER — FENTANYL CITRATE (PF) 100 MCG/2ML IJ SOLN
INTRAMUSCULAR | Status: AC
  Filled 2021-05-20: qty 2

## 2021-05-20 MED ORDER — FENTANYL CITRATE (PF) 100 MCG/2ML IJ SOLN
INTRAMUSCULAR | Status: DC | PRN
  Administered 2021-05-20: 15 ug via INTRATHECAL

## 2021-05-20 MED ORDER — PRENATAL MULTIVITAMIN CH: 1.0000 | ORAL_TABLET | Freq: Every day | ORAL | Status: DC

## 2021-05-20 MED ORDER — DEXAMETHASONE SODIUM PHOSPHATE 4 MG/ML IJ SOLN
INTRAMUSCULAR | Status: DC | PRN
  Administered 2021-05-20: 4 mg via INTRAVENOUS

## 2021-05-20 MED ORDER — ACETAMINOPHEN 10 MG/ML IV SOLN
INTRAVENOUS | Status: DC | PRN
  Administered 2021-05-20: 1000 mg via INTRAVENOUS

## 2021-05-20 MED ORDER — CEFAZOLIN SODIUM-DEXTROSE 2-4 GM/100ML-% IV SOLN
INTRAVENOUS | Status: AC
  Filled 2021-05-20: qty 100

## 2021-05-20 MED ORDER — SCOPOLAMINE 1 MG/3DAYS TD PT72
1.0000 | MEDICATED_PATCH | Freq: Once | TRANSDERMAL | Status: DC
  Administered 2021-05-20: 20:00:00 1.5 mg via TRANSDERMAL

## 2021-05-20 MED ORDER — OXYCODONE HCL 5 MG PO TABS: 5.0000 mg | ORAL_TABLET | ORAL | Status: DC | PRN

## 2021-05-20 MED ORDER — NALOXONE HCL 4 MG/10ML IJ SOLN
1.0000 ug/kg/h | INTRAVENOUS | Status: DC | PRN
  Filled 2021-05-20: qty 5

## 2021-05-20 MED ORDER — SCOPOLAMINE 1 MG/3DAYS TD PT72
MEDICATED_PATCH | TRANSDERMAL | Status: AC
  Filled 2021-05-20: qty 1

## 2021-05-20 MED ORDER — SIMETHICONE 80 MG PO CHEW: 80.0000 mg | CHEWABLE_TABLET | ORAL | Status: DC | PRN

## 2021-05-20 MED ORDER — KETOROLAC TROMETHAMINE 30 MG/ML IJ SOLN
30.0000 mg | Freq: Once | INTRAMUSCULAR | Status: AC | PRN
  Administered 2021-05-20: 19:00:00 30 mg via INTRAVENOUS

## 2021-05-20 MED ORDER — BUPIVACAINE IN DEXTROSE 0.75-8.25 % IT SOLN
INTRATHECAL | Status: DC | PRN
  Administered 2021-05-20: 12.75 mg via INTRATHECAL

## 2021-05-20 MED ORDER — LACTATED RINGERS IV SOLN: INTRAVENOUS | Status: DC

## 2021-05-20 MED ORDER — PHENYLEPHRINE HCL-NACL 20-0.9 MG/250ML-% IV SOLN
INTRAVENOUS | Status: DC | PRN
  Administered 2021-05-20: 30 ug/min via INTRAVENOUS

## 2021-05-20 MED ORDER — WITCH HAZEL-GLYCERIN EX PADS: 1.0000 "application " | MEDICATED_PAD | CUTANEOUS | Status: DC | PRN

## 2021-05-20 MED ORDER — PRENATAL MULTIVITAMIN CH
1.0000 | ORAL_TABLET | Freq: Every day | ORAL | Status: DC
  Administered 2021-05-21 – 2021-05-22 (×2): 1 via ORAL
  Filled 2021-05-20 (×2): qty 1

## 2021-05-20 MED ORDER — CEFAZOLIN SODIUM-DEXTROSE 2-4 GM/100ML-% IV SOLN: 2.0000 g | INTRAVENOUS | Status: DC

## 2021-05-20 SURGICAL SUPPLY — 39 items
BENZOIN TINCTURE PRP APPL 2/3 (GAUZE/BANDAGES/DRESSINGS) ×2 IMPLANT
CHLORAPREP W/TINT 26 (MISCELLANEOUS) ×2 IMPLANT
CLAMP CORD UMBIL (MISCELLANEOUS) IMPLANT
CLOTH BEACON ORANGE TIMEOUT ST (SAFETY) ×2 IMPLANT
DRAPE C SECTION CLR SCREEN (DRAPES) ×2 IMPLANT
DRESSING PEEL AND PLAC PRVNA20 (GAUZE/BANDAGES/DRESSINGS) IMPLANT
DRSG OPSITE POSTOP 4X10 (GAUZE/BANDAGES/DRESSINGS) ×2 IMPLANT
DRSG PEEL AND PLACE PREVENA 20 (GAUZE/BANDAGES/DRESSINGS) ×2
ELECT REM PT RETURN 9FT ADLT (ELECTROSURGICAL) ×2
ELECTRODE REM PT RTRN 9FT ADLT (ELECTROSURGICAL) ×1 IMPLANT
EXTRACTOR VACUUM KIWI (MISCELLANEOUS) IMPLANT
GLOVE BIO SURGEON STRL SZ7 (GLOVE) ×2 IMPLANT
GLOVE BIOGEL PI IND STRL 7.0 (GLOVE) ×2 IMPLANT
GLOVE BIOGEL PI INDICATOR 7.0 (GLOVE) ×2
GLOVE SURG POLYISO LF SZ6.5 (GLOVE) ×2 IMPLANT
GOWN STRL REUS W/ TWL LRG LVL3 (GOWN DISPOSABLE) ×3 IMPLANT
GOWN STRL REUS W/TWL LRG LVL3 (GOWN DISPOSABLE) ×3
KIT ABG SYR 3ML LUER SLIP (SYRINGE) IMPLANT
NDL HYPO 25X5/8 SAFETYGLIDE (NEEDLE) IMPLANT
NEEDLE HYPO 25X5/8 SAFETYGLIDE (NEEDLE) IMPLANT
NS IRRIG 1000ML POUR BTL (IV SOLUTION) ×2 IMPLANT
PACK C SECTION WH (CUSTOM PROCEDURE TRAY) ×2 IMPLANT
PAD OB MATERNITY 4.3X12.25 (PERSONAL CARE ITEMS) ×2 IMPLANT
PENCIL SMOKE EVAC W/HOLSTER (ELECTROSURGICAL) ×2 IMPLANT
RETAINER VISCERAL (MISCELLANEOUS) ×1 IMPLANT
RTRCTR C-SECT PINK 25CM LRG (MISCELLANEOUS) ×2 IMPLANT
STRIP CLOSURE SKIN 1/2X4 (GAUZE/BANDAGES/DRESSINGS) ×2 IMPLANT
SUT MNCRL 0 VIOLET CTX 36 (SUTURE) ×2 IMPLANT
SUT MNCRL+ AB 3-0 CT1 36 (SUTURE) ×2 IMPLANT
SUT MONOCRYL 0 CTX 36 (SUTURE) ×2
SUT MONOCRYL AB 3-0 CT1 36IN (SUTURE) ×3
SUT PDS AB 0 CTX 36 PDP370T (SUTURE) ×4 IMPLANT
SUT PLAIN 0 NONE (SUTURE) IMPLANT
SUT VIC AB 2-0 CT1 27 (SUTURE)
SUT VIC AB 2-0 CT1 TAPERPNT 27 (SUTURE) IMPLANT
SUT VIC AB 4-0 KS 27 (SUTURE) ×2 IMPLANT
TOWEL OR 17X24 6PK STRL BLUE (TOWEL DISPOSABLE) ×4 IMPLANT
TRAY FOLEY W/BAG SLVR 14FR LF (SET/KITS/TRAYS/PACK) ×2 IMPLANT
WATER STERILE IRR 1000ML POUR (IV SOLUTION) ×2 IMPLANT

## 2021-05-20 NOTE — H&P (Signed)
OB ADMISSION/ HISTORY & PHYSICAL:  Admission Date: 05/19/2021 10:17 PM  Admit Diagnosis: Elevated blood pressures  Stephanie Burton is a 30 y.o. female G2P1001 [redacted]w[redacted]d presenting with complaints of LOF and contractions while at home earlier. Endorses active FM, denies vaginal bleeding. Negative amnisure, negative fern.   Mild range elevated blood pressures while in MAU. Denies headache, vision changes or epigastric pain. NST - non-reactive, BPP 8/8.   History of current pregnancy: G2P1001   Primary OB Provider: Dr. Steva Ready [facility]) Patient entered care with [facility] at 10 wks.   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, has not been discussed with patient) Desires permanent sterilization (bilateral salpingectomy)  Patient Active Problem List   Diagnosis Date Noted   Elevated blood pressure affecting pregnancy in third trimester, antepartum 05/20/2021   Abnormal fetal ultrasound 04/30/2021   Herpes simplex antibody positive 09/01/2018   PCOS (polycystic ovarian syndrome) 06/14/2016   Anxiety 06/14/2016   Hirsutism 06/14/2016   Obesity 06/14/2016    Prenatal Labs: ABO, Rh: --/--/A POS (12/08 1517) Antibody: NEG (12/08 1517) Rubella: Immune (06/11 0000)  RPR: Nonreactive (06/11 0000)  HBsAg: Negative (06/11 0000)  HIV: Non-reactive (06/11 0000)  GTT: 94.6 GBS:   neg GC/CHL: negative    OB History  Gravida Para Term Preterm AB Living  2 1 1     1   SAB IAB Ectopic Multiple Live Births          1    # Outcome Date GA Lbr Len/2nd Weight Sex Delivery Anes PTL Lv  2 Current           1 Term 09/16/18    M CS-LTranv  N LIV     Complications: Fetal Intolerance    Medical / Surgical History: Past medical history:  Past Medical History:  Diagnosis Date   Eczema    Headache    PCOS (polycystic ovarian syndrome)    UTI (urinary tract infection)     Past  surgical history:  Past Surgical History:  Procedure Laterality Date   CESAREAN SECTION     Family History:  Family History  Problem Relation Age of Onset   Heart disease Mother        blockage,had stint   Diabetes Mother    Hypertension Father     Social History:  reports that she has never smoked. She has never used smokeless tobacco. She reports that she does not drink alcohol and does not use drugs.  Allergies: Latex   Current Medications at time of admission:  Prior to Admission medications   Medication Sig Start Date End Date Taking? Authorizing Provider  Prenatal MV & Min w/FA-DHA (PRENATAL GUMMIES PO) Take 2 tablets by mouth in the morning.   Yes [provider]  calcium carbonate (TUMS - DOSED IN MG ELEMENTAL CALCIUM) 500 MG chewable tablet Chew 2 tablets by mouth 3 (three) times daily as needed for indigestion or heartburn.    [provider]    Review of Systems: Constitutional: Negative   HENT: Negative   Eyes: Negative   Respiratory: Negative   Cardiovascular: Negative   Gastrointestinal: Negative  Genitourinary: neg for bloody show, neg for LOF   Musculoskeletal: Negative   Skin: Negative   Neurological: Negative   Endo/Heme/Allergies: Negative   Psychiatric/Behavioral: Negative    Physical Exam: VS: Blood pressure 133/70, pulse 98, temperature 97.9  F (36.6 C), temperature source Oral, resp. rate 20, height 5\' 9"  (1.753 m), weight (!) 158.6 kg, SpO2 98 %. AAO x3, no signs of distress Cardiovascular: RRR Respiratory: Lung fields clear to ausculation GU/GI: Abdomen gravid, non-tender, non-distended, active FM, vertex, EFW 8lbs per Leopold's Extremities: biltaeral edema, negative for pain, tenderness, and cords  Cervical exam:  FHR: baseline rate 150 / variability moderate / accelerations present / no decelerations TOCO: irregular   Prenatal Transfer Tool  Maternal Diabetes: No Genetic Screening: Normal Maternal  Ultrasounds/Referrals: Normal Fetal Ultrasounds or other Referrals:  None Maternal Substance Abuse:  No Significant Maternal Medications:  None Significant Maternal Lab Results: Group B Strep negative    Assessment: 30 y.o. G2P1001 [redacted]w[redacted]d being admitted for observation for mild range elevated blood pressures.   Plan:  Admit to [facility] for observation Repeat PIH labs in morning Continuous monitoring FHR category - Cat 1 Dr [facility] notified of admission and plan of care  Normand Sloop MSN, CNM 05/20/2021 3:01 AM

## 2021-05-20 NOTE — Transfer of Care (Signed)
Immediate Anesthesia Transfer of Care Note  Patient: Stephanie Burton  Procedure(s) Performed: REPEAT CESAREAN SECTION  Patient Location: PACU  Anesthesia Type:Spinal  Level of Consciousness: awake, alert  and oriented  Airway & Oxygen Therapy: Patient Spontanous Breathing  Post-op Assessment: Report given to RN and Post -op Vital signs reviewed and stable  Post vital signs: Reviewed and stable HR 89, RR 18, BP 132/75, SaO2 95%  HR89 Last Vitals:  Vitals Value Taken Time  BP    Temp    Pulse    Resp    SpO2      Last Pain:  Vitals:   05/20/21 1148  TempSrc: Oral  PainSc:          Complications: No notable events documented.

## 2021-05-20 NOTE — Progress Notes (Signed)
Antepartum Progress Note  Subjective: Patient feeling okay. Reports contractions every 15 minutes. Feeling frustrated and desires delivery of infant due to recent hospitalizations. Concerned about fetal heart rate. States she was told by MFM that 158bpm heart rate is too high and that if persistent would need delivery. Endorses good FM. Denies VB, LOF, or CTX.  Denies fevers, chills, chest pain, visual changes, SOB, RUQ/epigastric pain, N/V, dysuria, hematuria, or sudden onset/worsening bilateral LE or facial edema.  Objective: BP 129/76    Pulse 85    Temp 98 F (36.7 C) (Oral)    Resp 20    Ht 5\' 9"  (1.753 m)    Wt (!) 158.6 kg    SpO2 98%    BMI 51.63 kg/m  Gen:  NAD, pleasant and cooperative Cardio:  RRR Pulm:  CTAB, no wheezes/rales/rhonchi Abd:  Soft, gravid, non-distended, non-tender throughout, no rebound/guarding Ext:  No bilateral LE edema, no bilateral calf tenderness  Results for orders placed or performed during the hospital encounter of 05/19/21  Resp Panel by RT-PCR (Flu A&B, Covid) Nasopharyngeal Swab   Specimen: Nasopharyngeal Swab; Nasopharyngeal(NP) swabs in vial transport medium  Result Value Ref Range   SARS Coronavirus 2 by RT PCR NEGATIVE NEGATIVE   Influenza A by PCR NEGATIVE NEGATIVE   Influenza B by PCR NEGATIVE NEGATIVE  CBC  Result Value Ref Range   WBC 7.5 4.0 - 10.5 K/uL   RBC 4.55 3.87 - 5.11 MIL/uL   Hemoglobin 12.2 12.0 - 15.0 g/dL   HCT 05/21/21 78.2 - 95.6 %   MCV 81.1 80.0 - 100.0 fL   MCH 26.8 26.0 - 34.0 pg   MCHC 33.1 30.0 - 36.0 g/dL   RDW 21.3 08.6 - 57.8 %   Platelets 236 150 - 400 K/uL   nRBC 0.0 0.0 - 0.2 %  Comprehensive metabolic panel  Result Value Ref Range   Sodium 136 135 - 145 mmol/L   Potassium 4.1 3.5 - 5.1 mmol/L   Chloride 108 98 - 111 mmol/L   CO2 21 (L) 22 - 32 mmol/L   Glucose, Bld 123 (H) 70 - 99 mg/dL   BUN 8 6 - 20 mg/dL   Creatinine, Ser 46.9 0.44 - 1.00 mg/dL   Calcium 9.0 8.9 - 6.29 mg/dL   Total Protein 6.2 (L)  6.5 - 8.1 g/dL   Albumin 2.6 (L) 3.5 - 5.0 g/dL   AST 15 15 - 41 U/L   ALT 13 0 - 44 U/L   Alkaline Phosphatase 114 38 - 126 U/L   Total Bilirubin 0.4 0.3 - 1.2 mg/dL   GFR, Estimated NOT CALCULATED >60 mL/min   Anion gap 7 5 - 15  Protein / creatinine ratio, urine  Result Value Ref Range   Creatinine, Urine 159.92 mg/dL   Total Protein, Urine 11 mg/dL   Protein Creatinine Ratio 0.07 0.00 - 0.15 mg/mg[Cre]  Amnisure rupture of membrane (rom)not at [facility]  Result Value Ref Range   Amnisure ROM NEGATIVE   Comprehensive metabolic panel  Result Value Ref Range   Sodium 137 135 - 145 mmol/L   Potassium 4.0 3.5 - 5.1 mmol/L   Chloride 105 98 - 111 mmol/L   CO2 22 22 - 32 mmol/L   Glucose, Bld 104 (H) 70 - 99 mg/dL   BUN 9 6 - 20 mg/dL   Creatinine, Ser [facility] 0.44 - 1.00 mg/dL   Calcium 9.0 8.9 - 4.13 mg/dL   Total Protein 5.9 (L) 6.5 - 8.1 g/dL  Albumin 2.5 (L) 3.5 - 5.0 g/dL   AST 13 (L) 15 - 41 U/L   ALT 12 0 - 44 U/L   Alkaline Phosphatase 112 38 - 126 U/L   Total Bilirubin 0.4 0.3 - 1.2 mg/dL   GFR, Estimated >60 >60 mL/min   Anion gap 10 5 - 15  CBC  Result Value Ref Range   WBC 8.3 4.0 - 10.5 K/uL   RBC 4.28 3.87 - 5.11 MIL/uL   Hemoglobin 11.5 (L) 12.0 - 15.0 g/dL   HCT 35.4 (L) 36.0 - 46.0 %   MCV 82.7 80.0 - 100.0 fL   MCH 26.9 26.0 - 34.0 pg   MCHC 32.5 30.0 - 36.0 g/dL   RDW 13.6 11.5 - 15.5 %   Platelets 223 150 - 400 K/uL   nRBC 0.0 0.0 - 0.2 %  POCT fern test  Result Value Ref Range   POCT Fern Test Negative = intact amniotic membranes   Type and screen [place]  Result Value Ref Range   ABO/RH(D) A POS    Antibody Screen NEG    Sample Expiration      05/23/2021,2359 Performed at [facility], [address] [place], [place] [address]      A/P: Stephanie Burton is a 30 y.o. G2P1001 @ [redacted]w[redacted]d admitted for observation for elevated blood pressures x 2.  - Initial two Bps upon admission 150/81 and 147/81 - Preeclampsia labs  unremarkable, P/C ratio 0.07 - FHT overall reassuring, BPP 8/8 - BP while in room 139/83 - Recommend OBS throughout today, if she has another elevated BP greater than 140/90, recommend delivery - Counseled patient extensively regarding indications for cesarean section - Will reassess this afternoon  Drema Dallas, DO

## 2021-05-20 NOTE — Anesthesia Procedure Notes (Signed)
Spinal  Patient location during procedure: OB Start time: 05/20/2021 4:35 PM End time: 05/20/2021 4:40 PM Reason for block: surgical anesthesia Staffing Performed: anesthesiologist  Anesthesiologist: Trevor Iha, MD Preanesthetic Checklist Completed: patient identified, IV checked, risks and benefits discussed, surgical consent, monitors and equipment checked, pre-op evaluation and timeout performed Spinal Block Patient position: sitting Prep: DuraPrep and site prepped and draped Patient monitoring: heart rate, cardiac monitor, continuous pulse ox and blood pressure Approach: midline Location: L3-4 Injection technique: single-shot Needle Needle type: Whitacre  Needle gauge: 27 G Needle length: 12.7 cm Needle insertion depth: 10 cm Assessment Sensory level: T4 Events: CSF return Additional Notes  1 Attempt (s). Pt tolerated procedure well.

## 2021-05-20 NOTE — Anesthesia Preprocedure Evaluation (Addendum)
Anesthesia Evaluation  Patient identified by MRN, date of birth, ID band Patient awake    Reviewed: Allergy & Precautions, NPO status , Patient's Chart, lab work & pertinent test results  Airway Mallampati: III  TM Distance: >3 FB Neck ROM: Full    Dental no notable dental hx. (+) Teeth Intact, Dental Advisory Given   Pulmonary neg pulmonary ROS,    Pulmonary exam normal breath sounds clear to auscultation       Cardiovascular hypertension (gHTN), Normal cardiovascular exam Rhythm:Regular Rate:Normal     Neuro/Psych Anxiety    GI/Hepatic negative GI ROS, Neg liver ROS,   Endo/Other  Morbid obesity (BMI 51.63)  Renal/GU negative Renal ROS     Musculoskeletal   Abdominal (+) + obese,   Peds  Hematology Lab Results      Component                Value               Date                      WBC                      8.3                 05/20/2021                HGB                      11.5 (L)            05/20/2021                HCT                      35.4 (L)            05/20/2021                MCV                      82.7                05/20/2021                PLT                      223                 05/20/2021              Anesthesia Other Findings All: latex  Reproductive/Obstetrics (+) Pregnancy                            Anesthesia Physical Anesthesia Plan  ASA: 3  Anesthesia Plan: Spinal   Post-op Pain Management: Regional block and Dilaudid IV   Induction:   PONV Risk Score and Plan: 3 and Treatment may vary due to age or medical condition  Airway Management Planned: Natural Airway and Simple Face Mask  Additional Equipment: None  Intra-op Plan:   Post-operative Plan:   Informed Consent: I have reviewed the patients History and Physical, chart, labs and discussed the procedure including the risks, benefits and alternatives for the proposed anesthesia with the  patient or authorized representative who has indicated his/her understanding and acceptance.  Dental advisory given  Plan Discussed with: CRNA and Anesthesiologist  Anesthesia Plan Comments: (38.1 wk G2P1 w gHTN  BMI 51.63  for Rpt C/S under Spinal)       Anesthesia Quick Evaluation

## 2021-05-20 NOTE — Progress Notes (Addendum)
In to discuss plan of care - patient already previously made aware of plan for delivery due to ruling in for gestational HTN. Patient confirmed desires for bilateral tubal sterilization (bilateral salpingectomy). She no longer desires future pregnancy. Ancef on call ordered. Dr. Richardson Landry at bedside - plan to proceed with cesarean section now. L&D OR charge aware.  I have explained to the patient that this surgery is performed to deliver their baby or babies through an incision in the abdomen and incision in the uterus.  Prior to surgery, the risks and benefits of the surgery, as well as alternative treatments were discussed.  The risks include, but are not limited to, possible need for cesarean delivery for all future pregnancies, bleeding at the time of surgery that could necessitate a blood transfusion and/or hysterectomy, rupture of the uterus during a future pregnancy that could cause a preterm delivery and/or requiring hysterectomy, infection, damage to surrounding organs and tissues, damage to bladder, damage to ureters, causing kidney damage, and requiring additional procedures, damage to bowels, resulting in further surgery, postoperative pain, short-term and long-term, scarring on the abdominal wall and intra-abdominally, need for further surgery, development of an incisional hernia, deep vein thrombosis and/or pulmonary embolism, wound infection and/or separation, painful intercourse, urinary leakage, impact on future pregnancies including but not limited to, abnormal location or attachment of the placenta to the uterus, such as placenta previa or accreta, that may necessitate a blood transfusion and/or hysterectomy, impact on total family size, complications the course of which cannot be predicted or prevented, and death. Patient was counseled on the risks, benefits and alternatives of bilateral tubal sterilization.  Like any surgery this procedure carries risks including to but not limited to infection,  damage to surrounding structures requiring additional surgery, thromboembolism, and even death.  She understands that there is a failure rate of approximently 1-2%, and if she were to get pregnant after a bilateral tubal sterilization, there is a 50% chance of ectopic pregnancy.  She was advised that if she should miss her period andor have a positive pregnancy test after her bilateral tubal sterilization, she should seek medical assistance.  She is aware that this is a permanent procedure and that reversal is available but is very expensive and only 50% effective.   Patient was counseled regarding potential decrease in risk of ovarian cancer with removal of bilateral fallopian tubes. Patient was consented for blood products.  The patient is aware that bleeding may result in the need for a blood transfusion which includes risk of transmission of HIV (1:2 million), Hepatitis C (1:2 million), and Hepatitis B (1:200 thousand) and transfusion reaction.  Patient voiced understanding of the above risks as well as understanding of indications for blood transfusion.   Stephanie Ready, DO

## 2021-05-20 NOTE — Op Note (Addendum)
Pre Op Dx:   1. Single live IUP at [redacted]w[redacted]d 2. Gestational Hypertension 3. History of cesarean section (desires repeat) 4. Desires permanent sterilization  Post Op Dx:  Same as pre-operative diagnoses  Procedure:  Low Transverse Cesarean Section  Surgeon:  Dr. Steva Ready Assistants:  Dr. Gerald Leitz Anesthesia:  Spinal  EBL:  247cc  IVF:  1500cc UOP:  100cc  Drains:  Foley catheter  Specimen removed:  Placenta - sent to pathology   Bilateral fallopian tubes - sent to pathology Device(s) implanted:  None Case Type:  Clean-contaminated Findings: Normal-appearing uterus, bilateral fallopian tubes, and ovaries. Minimal fascial scarring. Fetus in cephalic position, clear amniotic fluid. Nuchal cord x 1. APGARS 8/9. Complications: None Indications:  30 y.o. G2P1001 at [redacted]w[redacted]d admitted for observation for elevated blood pressure. Eventually, she ruled in for gestational HTN and desired repeat cesarean section for mode of delivery. Patient also desires permanent sterilization.  Procedure:  After informed consent was obtained, the patient was brought to the operating room.  Following administration of spinal anesthesia, the patient was positioned in dorsal supine position with a leftward tilt and was prepped and draped in sterile fashion.  A preoperative time-out was performed.  The abdomen was entered in layers through a pfannenstiel incision and an Teacher, early years/pre was placed.  A low transverse hysterotomy was created sharply to the level of the membranes, then extended bluntly.  The fetus was delivered from cephalic presentation onto the field after nuchal cord reduced.  Bulb suctioning was performed.  The cord was doubly clamped and cut after a 60 second pause.  The newborn was passed to the warmer.  The placenta was delivered.  The uterus was swept free of clots and debris and closed in a running locked fashion with 0-Monocryl. An area superior to the left side of the hysterotomy was repaired  using 0-Monocryl. Hemostasis was verified. The left fallopian tube was identified by its fimbriae, lifted with a Babcock and divided from the normal-appearing left ovary and along the mesosalpinx using the Ligasure device and was passed off the field. This process was completed on the contralateral side.  The abdomen was irrigated with warmed saline and cleared of clots. Adequate hemostasis noted. The peritoneum was closed in a running fashion with 2-0 Vicryl.  Subfascial spaces were inspected and hemostasis assured.  The fascia was closed in a running fashion with 0-PDS.  The subcutaneous tissues were irrigated and hemostasis assured.  The subcutaneous tissues were closed with 3-0 Monocryl.  The skin was closed with 4-0 Vicryl. A Prevena wound system was applied.  The patient was transferred to PACU.  All needle, sponge, and instrument counts were correct at the end of the case.   Disposition:  PACU  I performed the procedure and the assistant was needed due to the complexity of the anatomy.  Steva Ready, DO

## 2021-05-21 ENCOUNTER — Encounter (HOSPITAL_COMMUNITY): Payer: Self-pay | Admitting: Obstetrics and Gynecology

## 2021-05-21 LAB — CBC
HCT: 32.3 % — ABNORMAL LOW (ref 36.0–46.0)
Hemoglobin: 10.4 g/dL — ABNORMAL LOW (ref 12.0–15.0)
MCH: 26.6 pg (ref 26.0–34.0)
MCHC: 32.2 g/dL (ref 30.0–36.0)
MCV: 82.6 fL (ref 80.0–100.0)
Platelets: 235 10*3/uL (ref 150–400)
RBC: 3.91 MIL/uL (ref 3.87–5.11)
RDW: 13.3 % (ref 11.5–15.5)
WBC: 12.4 10*3/uL — ABNORMAL HIGH (ref 4.0–10.5)
nRBC: 0 % (ref 0.0–0.2)

## 2021-05-21 NOTE — Anesthesia Postprocedure Evaluation (Signed)
Anesthesia Post Note  Patient: Marielouise Amey  Procedure(s) Performed: REPEAT CESAREAN SECTION     Patient location during evaluation: PACU Anesthesia Type: Spinal Level of consciousness: oriented and awake and alert Pain management: pain level controlled Vital Signs Assessment: post-procedure vital signs reviewed and stable Respiratory status: spontaneous breathing, respiratory function stable and patient connected to nasal cannula oxygen Cardiovascular status: blood pressure returned to baseline and stable Postop Assessment: no headache, no backache and no apparent nausea or vomiting Anesthetic complications: no   No notable events documented.  Last Vitals:  Vitals:   05/21/21 1438 05/21/21 1800  BP: 106/70 110/66  Pulse: (!) 102 96  Resp: 18 18  Temp: 36.7 C 36.5 C  SpO2: 97% 100%    Last Pain:  Vitals:   05/21/21 1800  TempSrc: Oral  PainSc:                  Latoya Diskin

## 2021-05-21 NOTE — Progress Notes (Signed)
Postpartum Note Day #1  S:  Patient doing well.  Pain controlled.  Tolerating regular diet. Has not ambulated yet. Foley still in place.  Denies fevers, chills, chest pain, SOB, N/V, or worsening bilateral LE edema.  Lochia: Minimal Infant feeding:  Breast Circumcision:  N/A, female infant Contraception:  S/p bilateral salpingectomy at the time of CS  O: Temp:  [97.7 F (36.5 C)-98 F (36.7 C)] 97.8 F (36.6 C) (12/29 0411) Pulse Rate:  [70-108] 78 (12/29 0101) Resp:  [13-23] 16 (12/29 0411) BP: (103-140)/(56-87) 103/66 (12/29 0101) SpO2:  [95 %-100 %] 97 % (12/29 0411) Gen: NAD, pleasant and cooperative Resp: No increased work of breathing Abdomen: soft, non-distended, non-tender throughout Uterus: firm, non-tender, below umbilicus Incision: Prevena in place with adequate suction Ext: Trace bilateral LE edema, no bilateral calf tenderness, SCDs on and working  UOP: 100-200cc/hour Foley: ~50cc amber colored urine  Labs:  Recent Labs    05/20/21 0529 05/21/21 0405  HGB 11.5* 10.4*    A/P: Patient is a 30 y.o. [id number] POD#1 s/p repeat LTCS+BS.  S/p LTCS+BS - Pain well controlled  - GU: UOP is adequate - GI: Tolerating regular diet - Activity: encouraged sitting up to chair and ambulation as tolerated - DVT Prophylaxis: SCDs, Lovenox, ambulation - Labs: as above  Gestational HTN - Normotensive since delivery - Will arrange 1 week BP check  Disposition:  D/C home POD#2-3   Steva Ready, DO [phone] (office)

## 2021-05-22 LAB — SURGICAL PATHOLOGY

## 2021-05-22 LAB — RPR: RPR Ser Ql: NONREACTIVE

## 2021-05-22 MED ORDER — IBUPROFEN 600 MG PO TABS: 600.0000 mg | ORAL_TABLET | Freq: Four times a day (QID) | ORAL | 1 refills | Status: AC

## 2021-05-22 MED ORDER — OXYCODONE HCL 5 MG PO TABS: 5.0000 mg | ORAL_TABLET | Freq: Four times a day (QID) | ORAL | 0 refills | Status: AC | PRN

## 2021-05-22 NOTE — Progress Notes (Signed)
Postpartum Note Day #2  S:  Patient doing well.  Pain controlled.  Tolerating regular diet. Ambulating and voiding without difficulty. Desires discharge home today if infant discharged.  Denies fevers, chills, chest pain, SOB, N/V, or worsening bilateral LE edema.  Lochia: Minimal Infant feeding:  Breast Circumcision:  N/A, female infant Contraception:  S/p bilateral salpingectomy at the time of CS  O: Temp:  [97.7 F (36.5 C)-98.2 F (36.8 C)] 98 F (36.7 C) (12/30 5809) Pulse Rate:  [86-102] 89 (12/30 0632) Resp:  [16-20] 16 (12/30 9833) BP: (106-122)/(49-70) 122/61 (12/30 8250) SpO2:  [97 %-100 %] 98 % (12/30 5397) Gen: NAD, pleasant and cooperative Resp: No increased work of breathing Abdomen: soft, non-distended, non-tender throughout Uterus: firm, non-tender, below umbilicus Incision: Prevena in place with adequate suction Ext: Trace bilateral LE edema, no bilateral calf tenderness  Labs:  Recent Labs    05/20/21 0529 05/21/21 0405  HGB 11.5* 10.4*    A/P: Patient is a 30 y.o. [id number] POD#2 s/p repeat LTCS+BS.  S/p LTCS+BS - Pain well controlled  - GU: UOP is adequate - GI: Tolerating regular diet - Activity: encouraged sitting up to chair and ambulation as tolerated - DVT Prophylaxis: SCDs, Lovenox, ambulation - Labs: as above  Gestational HTN - Normotensive since delivery - Will arrange 1 week BP check  Disposition:  D/C home POD#2-3   Steva Ready, DO [phone] (office)

## 2021-05-22 NOTE — Discharge Summary (Signed)
Postpartum Discharge Summary  Date of Service: 05/22/21      Patient Name: Stephanie Burton DOB: 16-Jun-1990 MRN: [id number]  Date of admission: 05/19/2021 Delivery date:05/20/2021  Delivering provider: Drema Dallas  Date of discharge: 05/22/2021  Admitting diagnosis: Elevated blood pressure affecting pregnancy in third trimester, antepartum [O16.3] Gestational hypertension [O13.9] Intrauterine pregnancy: [redacted]w[redacted]d    Secondary diagnosis:  Principal Problem:   Elevated blood pressure affecting pregnancy in third trimester, antepartum Active Problems:   Gestational hypertension  Additional problems: Obesity (BMI 42), Desires permanent sterilization    Discharge diagnosis: Term Pregnancy Delivered and Gestational Hypertension                                              Post partum procedures: N/A Augmentation: N/A Complications: None  Hospital course: Sceduled C/S   30y.o. yo G2P2002 at 360w1das admitted to the hospital 05/19/2021 for scheduled cesarean section with the following indication:Elective Repeat and Gestational Hypertension .Delivery details are as follows:  Membrane Rupture Time/Date: 5:10 PM ,05/20/2021   Delivery Method:C-Section, Low Transverse  Details of operation can be found in separate operative note.  Patient had an uncomplicated postpartum course.  She is ambulating, tolerating a regular diet, passing flatus, and urinating well. Patient is discharged home in stable condition on  05/22/21        Newborn Data: Birth date:05/20/2021  Birth time:5:11 PM  Gender:Female  Living status:Living  Apgars:8 ,9  Weight:3150 g     Magnesium Sulfate received: No BMZ received: No Rhophylac:N/A MMR:N/A T-DaP:Given prenatally Flu: Yes Transfusion:No  Physical exam  Vitals:   05/21/21 0928 05/21/21 1438 05/21/21 1800 05/22/21 0632  BP: (!) 115/49 106/70 110/66 122/61  Pulse: 86 (!) 102 96 89  Resp: 20 18 18 16   Temp: 98.2 F (36.8 C) 98 F (36.7 C) 97.7 F (36.5  C) 98 F (36.7 C)  TempSrc: Oral Oral Oral Oral  SpO2: 97% 97% 100% 98%  Weight:      Height:       General: alert, cooperative, and no distress Cardio: RRR Lungs:  CTAB, no wheezes/rales/rhonchi Lochia: appropriate Uterine Fundus: firm Incision: Prevena in place with adequate suction DVT Evaluation: No evidence of DVT seen on physical exam. No cords or calf tenderness. Labs: Lab Results  Component Value Date   WBC 12.4 (H) 05/21/2021   HGB 10.4 (L) 05/21/2021   HCT 32.3 (L) 05/21/2021   MCV 82.6 05/21/2021   PLT 235 05/21/2021   CMP Latest Ref Rng & Units 05/20/2021  Glucose 70 - 99 mg/dL 104(H)  BUN 6 - 20 mg/dL 9  Creatinine 0.44 - 1.00 mg/dL 0.87  Sodium 135 - 145 mmol/L 137  Potassium 3.5 - 5.1 mmol/L 4.0  Chloride 98 - 111 mmol/L 105  CO2 22 - 32 mmol/L 22  Calcium 8.9 - 10.3 mg/dL 9.0  Total Protein 6.5 - 8.1 g/dL 5.9(L)  Total Bilirubin 0.3 - 1.2 mg/dL 0.4  Alkaline Phos 38 - 126 U/L 112  AST 15 - 41 U/L 13(L)  ALT 0 - 44 U/L 12   Edinburgh Score: Edinburgh Postnatal Depression Scale Screening Tool 05/21/2021  I have been able to laugh and see the funny side of things. 0  I have looked forward with enjoyment to things. 0  I have blamed myself unnecessarily when things went wrong. 2  I have been anxious  or worried for no good reason. 1  I have felt scared or panicky for no good reason. 0  Things have been getting on top of me. 0  I have been so unhappy that I have had difficulty sleeping. 0  I have felt sad or miserable. 0  I have been so unhappy that I have been crying. 0  The thought of harming myself has occurred to me. 0  Edinburgh Postnatal Depression Scale Total 3      After visit meds:  Allergies as of 05/22/2021       Reactions   Latex Anaphylaxis        Medication List     TAKE these medications    calcium carbonate 500 MG chewable tablet Commonly known as: TUMS - dosed in mg elemental calcium Chew 2 tablets by mouth 3 (three)  times daily as needed for indigestion or heartburn.   ibuprofen 600 MG tablet Commonly known as: ADVIL Take 1 tablet (600 mg total) by mouth every 6 (six) hours.   oxyCODONE 5 MG immediate release tablet Commonly known as: Oxy IR/ROXICODONE Take 1-2 tablets (5-10 mg total) by mouth every 6 (six) hours as needed for moderate pain, breakthrough pain or severe pain.   PRENATAL GUMMIES PO Take 2 tablets by mouth in the morning.         Discharge home in stable condition Infant Feeding: Breast Infant Disposition:home with mother Discharge instruction: per After Visit Summary and Postpartum booklet. Activity: Advance as tolerated. Pelvic rest for 6 weeks.  Diet: routine diet Anticipated Birth Control:  Status post bilateral salpingectomy at time of cesarean section Postpartum Appointment:6 weeks Additional Postpartum F/U: Incision check 1 week and BP check 1 week Future Appointments:No future appointments. Follow up Visit:  Follow-up Information     Drema Dallas, DO Follow up in 1 week(s).   Specialty: Obstetrics and Gynecology Why: Our office will arrange a 1 week incision/blood pressure check and 6 week postpartum visit. Please call the office in a few days if you have not received a phone call. Contact information: [address] [place] [address] [phone]                    05/22/2021 Drema Dallas, DO

## 2021-05-22 NOTE — Progress Notes (Signed)
MOB was referred for history of anxiety. °* Referral screened out by Clinical Social Worker because none of the following criteria appear to apply: °~ History of anxiety/depression during this pregnancy, or of post-partum depression following prior delivery. No concerns noted in prenatal records.  °~ Diagnosis of anxiety and/or depression within last 3 years. Per chart review, MOB's anxiety dates back to 2018.  °OR °* MOB's symptoms currently being treated with medication and/or therapy. °Please contact the Clinical Social Worker if needs arise, by MOB request, or if MOB scores greater than 9/yes to question 10 on Edinburgh Postpartum Depression Screen. ° °Kendall Justo, LCSW °Clinical Social Worker °[facility] °Cell#: [phone] ° °

## 2021-05-25 ENCOUNTER — Other Ambulatory Visit (HOSPITAL_COMMUNITY)
Admission: RE | Admit: 2021-05-25 | Discharge: 2021-05-25 | Disposition: A | Discharge status: Home / Self Care | Payer: No Typology Code available for payment source | Source: Ambulatory Visit | Attending: Obstetrics and Gynecology | Admitting: Obstetrics and Gynecology

## 2021-05-26 ENCOUNTER — Inpatient Hospital Stay (HOSPITAL_COMMUNITY)
Admission: RE | Admit: 2021-05-26 | Payer: No Typology Code available for payment source | Source: Home / Self Care | Admitting: Obstetrics and Gynecology

## 2021-06-02 ENCOUNTER — Telehealth (HOSPITAL_COMMUNITY): Payer: Self-pay | Admitting: *Deleted

## 2021-06-02 NOTE — Telephone Encounter (Signed)
Patient voiced no questions or concerns at this time. EPDS= 1 Patient voiced no questions or concerns regarding infant at this time. Patient reports infant sleeps in a bassinet on her back. RN reviewed ABCs of safe sleep. Patient verbalized understanding. Patient informed about hospital's virtual postpartum classes and support groups - declined email information. Deforest Hoyles, RN, 06/02/21, [phone]

## 2022-08-01 ENCOUNTER — Emergency Department (HOSPITAL_BASED_OUTPATIENT_CLINIC_OR_DEPARTMENT_OTHER)
Admission: EM | Admit: 2022-08-01 | Discharge: 2022-08-01 | Disposition: A | Discharge status: Home / Self Care | Payer: BC Managed Care – PPO | Attending: Emergency Medicine | Admitting: Emergency Medicine

## 2022-08-01 ENCOUNTER — Encounter (HOSPITAL_BASED_OUTPATIENT_CLINIC_OR_DEPARTMENT_OTHER): Payer: Self-pay | Admitting: Emergency Medicine

## 2022-08-01 DIAGNOSIS — J029 Acute pharyngitis, unspecified: Secondary | ICD-10-CM | POA: Insufficient documentation

## 2022-08-01 DIAGNOSIS — R509 Fever, unspecified: Secondary | ICD-10-CM | POA: Diagnosis present

## 2022-08-01 MED ORDER — DEXAMETHASONE SODIUM PHOSPHATE 10 MG/ML IJ SOLN
8.0000 mg | Freq: Once | INTRAMUSCULAR | Status: AC
  Administered 2022-08-01: 8 mg via INTRAMUSCULAR
  Filled 2022-08-01: qty 1

## 2022-08-01 MED ORDER — PENICILLIN G BENZATHINE 1200000 UNIT/2ML IM SUSY
1.2000 10*6.[IU] | PREFILLED_SYRINGE | Freq: Once | INTRAMUSCULAR | Status: AC
  Administered 2022-08-01: 1.2 10*6.[IU] via INTRAMUSCULAR
  Filled 2022-08-01: qty 2

## 2022-08-01 NOTE — ED Provider Notes (Signed)
[facility] Provider Note   CSN: [id number] Arrival date & time: 08/01/22  [id number]     History  Chief Complaint  Patient presents with   Cough    Stephanie Burton is a 32 y.o. female presenting to ED with predominant complaint of sore throat and fevers for 4 to 5 days.  She is able to swallow.  She has also had a mild persistent cough and a headache.  She tested negative for flu and COVID at work 4 days ago, so she had a negative strep test as well, but her sore throat is getting worse.  HPI     Home Medications Prior to Admission medications   Medication Sig Start Date End Date Taking? Authorizing Provider  calcium carbonate (TUMS - DOSED IN MG ELEMENTAL CALCIUM) 500 MG chewable tablet Chew 2 tablets by mouth 3 (three) times daily as needed for indigestion or heartburn.    [provider]  ibuprofen (ADVIL) 600 MG tablet Take 1 tablet (600 mg total) by mouth every 6 (six) hours. 05/22/21   Drema Dallas, DO  oxyCODONE (OXY IR/ROXICODONE) 5 MG immediate release tablet Take 1-2 tablets (5-10 mg total) by mouth every 6 (six) hours as needed for moderate pain, breakthrough pain or severe pain. 05/22/21   Drema Dallas, DO  Prenatal MV & Min w/FA-DHA (PRENATAL GUMMIES PO) Take 2 tablets by mouth in the morning.    [provider]      Allergies    Latex    Review of Systems   Review of Systems  Physical Exam Updated Vital Signs BP 115/84   Pulse 98   Temp 98.3 F (36.8 C) (Oral)   Resp 16   SpO2 96%  Physical Exam Constitutional:      General: She is not in acute distress. HENT:     Head: Normocephalic and atraumatic.     Comments: Bilateral tonsillar erythema and exudates, uvula is midline Eyes:     Conjunctiva/sclera: Conjunctivae normal.     Pupils: Pupils are equal, round, and reactive to light.  Cardiovascular:     Rate and Rhythm: Normal rate and regular rhythm.  Pulmonary:     Effort: Pulmonary  effort is normal. No respiratory distress.  Abdominal:     General: There is no distension.     Tenderness: There is no abdominal tenderness.  Skin:    General: Skin is warm and dry.  Neurological:     General: No focal deficit present.     Mental Status: She is alert. Mental status is at baseline.  Psychiatric:        Mood and Affect: Mood normal.        Behavior: Behavior normal.     ED Results / Procedures / Treatments   Labs (all labs ordered are listed, but only abnormal results are displayed) Labs Reviewed - No data to display  EKG None  Radiology No results found.  Procedures Procedures    Medications Ordered in ED Medications  penicillin g benzathine (BICILLIN LA) 1200000 UNIT/2ML injection 1.2 Million Units (has no administration in time range)  dexamethasone (DECADRON) injection 8 mg (has no administration in time range)    ED Course/ Medical Decision Making/ A&P                             Medical Decision Making Risk Prescription drug management.   Patient is here with sore  throat found no tonsillar exudates on exam.  Clinically this could be consistent with strep pharyngitis.  She was given intramuscular penicillin and Decadron and discharge.  Do not see a need to repeat COVID and flu this week due to the stability of improved fluid. No indication for blood work or hospitalization.  Doubt peritonsillar abscess or Ludwig's angina        Final Clinical Impression(s) / ED Diagnoses Final diagnoses:  Sore throat    Rx / DC Orders ED Discharge Orders     None         Wyvonnia Dusky, MD 08/01/22 [phone]

## 2022-08-01 NOTE — ED Triage Notes (Signed)
Pt reports that since Thursday she has had chills, sore throat, and lightheadedness. Had productive cough, runny nose since last night and L ear pain. Had covid, flu, and strep swab that was negative.

## 2022-10-27 ENCOUNTER — Emergency Department (HOSPITAL_BASED_OUTPATIENT_CLINIC_OR_DEPARTMENT_OTHER)
Admission: EM | Admit: 2022-10-27 | Discharge: 2022-10-27 | Disposition: A | Discharge status: Home / Self Care | Payer: BC Managed Care – PPO | Attending: Emergency Medicine | Admitting: Emergency Medicine

## 2022-10-27 ENCOUNTER — Encounter (HOSPITAL_BASED_OUTPATIENT_CLINIC_OR_DEPARTMENT_OTHER): Payer: Self-pay | Admitting: *Deleted

## 2022-10-27 ENCOUNTER — Other Ambulatory Visit: Payer: Self-pay

## 2022-10-27 DIAGNOSIS — H65191 Other acute nonsuppurative otitis media, right ear: Secondary | ICD-10-CM

## 2022-10-27 DIAGNOSIS — R0989 Other specified symptoms and signs involving the circulatory and respiratory systems: Secondary | ICD-10-CM | POA: Insufficient documentation

## 2022-10-27 DIAGNOSIS — H9201 Otalgia, right ear: Secondary | ICD-10-CM | POA: Insufficient documentation

## 2022-10-27 DIAGNOSIS — Z91040 Latex allergy status: Secondary | ICD-10-CM | POA: Insufficient documentation

## 2022-10-27 MED ORDER — LORATADINE 10 MG PO TABS
10.0000 mg | ORAL_TABLET | Freq: Every day | ORAL | 0 refills | Status: AC
Start: 2022-10-27 — End: ?

## 2022-10-27 MED ORDER — FLUTICASONE PROPIONATE 50 MCG/ACT NA SUSP: 1.0000 | Freq: Every day | NASAL | 0 refills | Status: AC

## 2022-10-27 MED ORDER — AMOXICILLIN 500 MG PO CAPS: 500.0000 mg | ORAL_CAPSULE | Freq: Two times a day (BID) | ORAL | 0 refills | Status: AC

## 2022-10-27 NOTE — ED Notes (Signed)
Patient verbalizes understanding of discharge instructions. Opportunity for questioning and answers were provided. Patient discharged from ED.  °

## 2022-10-27 NOTE — ED Triage Notes (Addendum)
Right ear fullness x1 week.  Provider at her job looked at ear and gave her doxycycline which pt has been taking.  Pt feels that ear is not getting better, ear feels stopped up and she feels she can not hear from right ear.

## 2022-10-27 NOTE — ED Provider Notes (Signed)
[place] EMERGENCY DEPARTMENT AT [facility] Provider Note   CSN: [id number] Arrival date & time: 10/27/22  1324    History  Chief Complaint  Patient presents with   Otalgia    Stephanie Burton is a 32 y.o. female history of seasonal allergies on Zyrtec here for evaluation of right ear pain.  Began 2 days ago.  Initially right ear was painful, pain resolved and she now has a dull fullness and achiness to the right ear.  No pain behind the ear, numbness, weakness, tinnitus.  Pain began she also had a sore throat, congestion and rhinorrhea.  Sore throat resolved still has some mild congestion and rhinorrhea.  Employer gave her 3 days of doxycycline, has taken 2 days and has not helped.  No neck pain, cough, congestion, chest pain.  She is compliant with Zyrtec at home for her seasonal allergies.  No recent swim activities, drainage to ear.  No falls or injuries.  No bloody drainage from ears    HPI     Home Medications Prior to Admission medications   Medication Sig Start Date End Date Taking? Authorizing Provider  amoxicillin (AMOXIL) 500 MG capsule Take 1 capsule (500 mg total) by mouth 2 (two) times daily for 7 days. 10/27/22 11/03/22 Yes Sabreena Vogan A, PA-C  fluticasone (FLONASE) 50 MCG/ACT nasal spray Place 1 spray into both nostrils daily. 10/27/22  Yes Hazelle Woollard A, PA-C  loratadine (CLARITIN) 10 MG tablet Take 1 tablet (10 mg total) by mouth daily. 10/27/22  Yes Danthony Kendrix A, PA-C  calcium carbonate (TUMS - DOSED IN MG ELEMENTAL CALCIUM) 500 MG chewable tablet Chew 2 tablets by mouth 3 (three) times daily as needed for indigestion or heartburn.    [provider]  ibuprofen (ADVIL) 600 MG tablet Take 1 tablet (600 mg total) by mouth every 6 (six) hours. 05/22/21   Steva Ready, DO  oxyCODONE (OXY IR/ROXICODONE) 5 MG immediate release tablet Take 1-2 tablets (5-10 mg total) by mouth every 6 (six) hours as needed for moderate pain, breakthrough pain or  severe pain. 05/22/21   Steva Ready, DO  Prenatal MV & Min w/FA-DHA (PRENATAL GUMMIES PO) Take 2 tablets by mouth in the morning.    [provider]      Allergies    Latex    Review of Systems   Review of Systems  Constitutional: Negative.   HENT:  Positive for congestion, ear pain, postnasal drip, rhinorrhea and sore throat (resolved). Negative for dental problem, drooling, ear discharge, facial swelling, hearing loss, mouth sores, nosebleeds, sinus pressure, sinus pain, sneezing, tinnitus, trouble swallowing and voice change.   Respiratory: Negative.    Cardiovascular: Negative.   Gastrointestinal: Negative.   Genitourinary: Negative.   Musculoskeletal: Negative.   Skin: Negative.   Neurological: Negative.   All other systems reviewed and are negative.   Physical Exam Updated Vital Signs BP (!) 142/89   Pulse 100   Temp 98.1 F (36.7 C) (Oral)   Resp 16   Wt (!) 148.1 kg   SpO2 100%   Breastfeeding No   BMI 48.22 kg/m  Physical Exam Vitals and nursing note reviewed.  Constitutional:      General: She is not in acute distress.    Appearance: She is well-developed. She is not ill-appearing, toxic-appearing or diaphoretic.  HENT:     Head: Normocephalic and atraumatic.     Right Ear: Ear canal and external ear normal. There is no impacted cerumen.  Left Ear: Tympanic membrane, ear canal and external ear normal. There is no impacted cerumen.     Ears:     Comments: Right clear TM effusion to right ear with some mild erythema.  No drainage to the canal.  Nontender tragus, pinna bilaterally Nontender mastoid, no erythema bilaterally    Nose: Rhinorrhea present.     Comments: Clear rhinorrhea bilateral nares    Mouth/Throat:     Mouth: Mucous membranes are moist.     Comments: P.o. clear, uvula midline, no PTA or RPA Eyes:     Pupils: Pupils are equal, round, and reactive to light.  Neck:     Comments: Full range of motion, no rigidity Cardiovascular:      Rate and Rhythm: Normal rate.  Pulmonary:     Effort: No respiratory distress.  Abdominal:     General: There is no distension.  Musculoskeletal:        General: Normal range of motion.     Cervical back: Normal range of motion and neck supple.  Skin:    General: Skin is warm and dry.  Neurological:     General: No focal deficit present.     Mental Status: She is alert.  Psychiatric:        Mood and Affect: Mood normal.     ED Results / Procedures / Treatments   Labs (all labs ordered are listed, but only abnormal results are displayed) Labs Reviewed - No data to display  EKG None  Radiology No results found.  Procedures Procedures    Medications Ordered in ED Medications - No data to display  ED Course/ Medical Decision Making/ A&P   32 year old here for evaluation of right ear fullness.  2 days ago had sore throat, congestion, rhinorrhea and pain to right ear, sore throat and pain to ear resolved however she has persistent fullness to ear.  No drainage, swimming activities.  She has no evidence of mastoiditis bilaterally.  No neck stiffness or neck rigidity, low suspicion for meningitis.  Heart/lungs clear.  No fever. Nvintact.  Does have clear rhinorrhea bilateral nares with history of seasonal allergies.  P.o. clear, low suspicion for bacterial pharyngitis, PTA, RPA.  Tolerating p.o. intake at home.  Left TM clear.  Right TM with clear effusion with some mild erythema to TM.  I suspect this is likely viral versus allergic.  Will have her switch to Claritin for antihistamine use, Flonase.  If that does not improve or she develops fever started on amoxicillin.  Encouraged her to follow-up outpatient, return for new or worsening symptoms.  The patient has been appropriately medically screened and/or stabilized in the ED. I have low suspicion for any other emergent medical condition which would require further screening, evaluation or treatment in the ED or require  inpatient management.  Patient is hemodynamically stable and in no acute distress.  Patient able to ambulate in department prior to ED.  Evaluation does not show acute pathology that would require ongoing or additional emergent interventions while in the emergency department or further inpatient treatment.  I have discussed the diagnosis with the patient and answered all questions.  Pain is been managed while in the emergency department and patient has no further complaints prior to discharge.  Patient is comfortable with plan discussed in room and is stable for discharge at this time.  I have discussed strict return precautions for returning to the emergency department.  Patient was encouraged to follow-up with PCP/specialist refer to at  discharge.                              Medical Decision Making Amount and/or Complexity of Data Reviewed External Data Reviewed: labs, radiology and notes.  Risk OTC drugs. Prescription drug management. Decision regarding hospitalization. Diagnosis or treatment significantly limited by social determinants of health.          Final Clinical Impression(s) / ED Diagnoses Final diagnoses:  Acute effusion of right ear    Rx / DC Orders ED Discharge Orders          Ordered    amoxicillin (AMOXIL) 500 MG capsule  2 times daily        10/27/22 1500    loratadine (CLARITIN) 10 MG tablet  Daily        10/27/22 1500    fluticasone (FLONASE) 50 MCG/ACT nasal spray  Daily        10/27/22 1500              Walaa Carel A, PA-C 10/27/22 1519    Linwood Dibbles, MD 10/28/22 [phone]

## 2022-10-27 NOTE — Discharge Instructions (Signed)
Stop taking the Doxycycline.  Start the flonase and Claritin. Stop the zyrtec  If symptoms not improved by day 3 of the Flonase and Claritin start the Amoxicillin.  Follow up with PCP  Return for new or worsening symptoms
